# Patient Record
Sex: Male | Born: 1950 | Race: White | Hispanic: No | Marital: Married | Smoking: Never smoker
Health system: Southern US, Community
[De-identification: ages and names within clinical notes are randomized; demographics above are authoritative.]

## PROBLEM LIST (undated history)

## (undated) DIAGNOSIS — E785 Hyperlipidemia, unspecified: Secondary | ICD-10-CM

## (undated) DIAGNOSIS — R112 Nausea with vomiting, unspecified: Secondary | ICD-10-CM

## (undated) DIAGNOSIS — Z9889 Other specified postprocedural states: Secondary | ICD-10-CM

## (undated) DIAGNOSIS — N189 Chronic kidney disease, unspecified: Secondary | ICD-10-CM

## (undated) DIAGNOSIS — I499 Cardiac arrhythmia, unspecified: Secondary | ICD-10-CM

## (undated) DIAGNOSIS — G4730 Sleep apnea, unspecified: Secondary | ICD-10-CM

## (undated) HISTORY — DX: Chronic kidney disease, unspecified: N18.9

## (undated) HISTORY — PX: OTHER SURGICAL HISTORY: SHX169

## (undated) HISTORY — DX: Nausea with vomiting, unspecified: R11.2

## (undated) HISTORY — DX: Hyperlipidemia, unspecified: E78.5

## (undated) HISTORY — PX: CHOLECYSTECTOMY: SHX55

## (undated) HISTORY — DX: Other specified postprocedural states: Z98.890

---

## 1988-10-16 HISTORY — PX: KNEE ARTHROSCOPY W/ ACL RECONSTRUCTION: SHX1858

## 1990-10-16 HISTORY — PX: MENISCUS REPAIR: SHX5179

## 1994-10-16 DIAGNOSIS — Z9889 Other specified postprocedural states: Secondary | ICD-10-CM

## 1994-10-16 HISTORY — DX: Other specified postprocedural states: Z98.890

## 2010-10-16 HISTORY — PX: COLONOSCOPY: SHX174

## 2012-10-16 HISTORY — PX: ROTATOR CUFF REPAIR: SHX139

## 2016-10-16 DIAGNOSIS — C44229 Squamous cell carcinoma of skin of left ear and external auricular canal: Secondary | ICD-10-CM

## 2016-10-16 HISTORY — DX: Squamous cell carcinoma of skin of left ear and external auricular canal: C44.229

## 2016-11-24 ENCOUNTER — Other Ambulatory Visit: Payer: Medicare Other | Admitting: Internal Medicine

## 2016-11-24 DIAGNOSIS — Z13220 Encounter for screening for lipoid disorders: Secondary | ICD-10-CM

## 2016-11-24 DIAGNOSIS — Z125 Encounter for screening for malignant neoplasm of prostate: Secondary | ICD-10-CM

## 2016-11-24 DIAGNOSIS — Z0000 Encounter for general adult medical examination without abnormal findings: Secondary | ICD-10-CM

## 2016-11-24 LAB — CBC WITH DIFFERENTIAL/PLATELET
BASOS ABS: 48 {cells}/uL (ref 0–200)
Basophils Relative: 1 %
EOS PCT: 3 %
Eosinophils Absolute: 144 cells/uL (ref 15–500)
HCT: 42.8 % (ref 38.5–50.0)
Hemoglobin: 14.2 g/dL (ref 13.2–17.1)
Lymphocytes Relative: 42 %
Lymphs Abs: 2016 cells/uL (ref 850–3900)
MCH: 29.3 pg (ref 27.0–33.0)
MCHC: 33.2 g/dL (ref 32.0–36.0)
MCV: 88.4 fL (ref 80.0–100.0)
MONOS PCT: 10 %
MPV: 10 fL (ref 7.5–12.5)
Monocytes Absolute: 480 cells/uL (ref 200–950)
NEUTROS ABS: 2112 {cells}/uL (ref 1500–7800)
NEUTROS PCT: 44 %
PLATELETS: 243 10*3/uL (ref 140–400)
RBC: 4.84 MIL/uL (ref 4.20–5.80)
RDW: 13.6 % (ref 11.0–15.0)
WBC: 4.8 10*3/uL (ref 3.8–10.8)

## 2016-11-24 LAB — LIPID PANEL
Cholesterol: 156 mg/dL (ref <200)
HDL: 46 mg/dL (ref >40)
LDL CALC: 92 mg/dL (ref <100)
Total CHOL/HDL Ratio: 3.4 Ratio (ref <5.0)
Triglycerides: 91 mg/dL (ref <150)
VLDL: 18 mg/dL (ref <30)

## 2016-11-24 LAB — COMPREHENSIVE METABOLIC PANEL
ALT: 27 U/L (ref 9–46)
AST: 21 U/L (ref 10–35)
Albumin: 4.5 g/dL (ref 3.6–5.1)
Alkaline Phosphatase: 75 U/L (ref 40–115)
BUN: 23 mg/dL (ref 7–25)
CHLORIDE: 108 mmol/L (ref 98–110)
CO2: 23 mmol/L (ref 20–31)
CREATININE: 1.21 mg/dL (ref 0.70–1.25)
Calcium: 9.8 mg/dL (ref 8.6–10.3)
GLUCOSE: 100 mg/dL — AB (ref 65–99)
Potassium: 5.1 mmol/L (ref 3.5–5.3)
SODIUM: 141 mmol/L (ref 135–146)
Total Bilirubin: 1.3 mg/dL — ABNORMAL HIGH (ref 0.2–1.2)
Total Protein: 6.7 g/dL (ref 6.1–8.1)

## 2016-11-24 LAB — PSA: PSA: 1.5 ng/mL (ref <=4.0)

## 2016-11-28 ENCOUNTER — Encounter: Payer: Self-pay | Admitting: Internal Medicine

## 2016-11-28 ENCOUNTER — Ambulatory Visit (INDEPENDENT_AMBULATORY_CARE_PROVIDER_SITE_OTHER): Payer: Medicare Other | Admitting: Internal Medicine

## 2016-11-28 VITALS — BP 132/80 | HR 50 | Ht 67.0 in | Wt 206.0 lb

## 2016-11-28 DIAGNOSIS — I459 Conduction disorder, unspecified: Secondary | ICD-10-CM

## 2016-11-28 DIAGNOSIS — Z23 Encounter for immunization: Secondary | ICD-10-CM | POA: Diagnosis not present

## 2016-11-28 DIAGNOSIS — Z0000 Encounter for general adult medical examination without abnormal findings: Secondary | ICD-10-CM

## 2016-11-28 DIAGNOSIS — I455 Other specified heart block: Secondary | ICD-10-CM | POA: Diagnosis not present

## 2016-11-28 DIAGNOSIS — I440 Atrioventricular block, first degree: Secondary | ICD-10-CM | POA: Diagnosis not present

## 2016-11-28 LAB — POCT URINALYSIS DIPSTICK
Bilirubin, UA: NEGATIVE
Blood, UA: NEGATIVE
GLUCOSE UA: NEGATIVE
Ketones, UA: NEGATIVE
LEUKOCYTES UA: NEGATIVE
NITRITE UA: NEGATIVE
Protein, UA: NEGATIVE
Spec Grav, UA: 1.02
UROBILINOGEN UA: NEGATIVE
pH, UA: 6

## 2016-11-28 NOTE — Patient Instructions (Signed)
It was pleasure to see you today. Recommend 24-hour Holter monitor to check for possible dysrhythmia. Prevnar 13 given today. Return in one year or as needed.
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
## 2016-12-12 ENCOUNTER — Encounter: Payer: Self-pay | Admitting: *Deleted

## 2016-12-12 ENCOUNTER — Ambulatory Visit (INDEPENDENT_AMBULATORY_CARE_PROVIDER_SITE_OTHER): Payer: Medicare Other

## 2016-12-12 DIAGNOSIS — I440 Atrioventricular block, first degree: Secondary | ICD-10-CM | POA: Diagnosis not present

## 2016-12-12 NOTE — Progress Notes (Signed)
OK. Thanks.

## 2016-12-12 NOTE — Progress Notes (Signed)
Patient ID: Jacob Le, male   DOB: [birth year], 66 y.o.   MRN: [id number] Patient did not show up for 12/12/16, 12:00 PM, appointment, to have a 24 hour holter monitor applied.

## 2016-12-12 NOTE — Progress Notes (Signed)
Patient ID: Jacob Le, male   DOB: 1950-12-31, 66 y.o.   MRN: [id number] Patient showed up at 2:00 PM for his 12:00 PM, appointment to have a 24 hour holter monitor applied.  Monitor will be applied today.

## 2017-01-04 ENCOUNTER — Ambulatory Visit (INDEPENDENT_AMBULATORY_CARE_PROVIDER_SITE_OTHER): Payer: Medicare Other | Admitting: Pulmonary Disease

## 2017-01-04 ENCOUNTER — Encounter: Payer: Self-pay | Admitting: Pulmonary Disease

## 2017-01-04 VITALS — BP 118/78 | HR 69 | Ht 67.0 in | Wt 207.0 lb

## 2017-01-04 DIAGNOSIS — R0683 Snoring: Secondary | ICD-10-CM | POA: Diagnosis not present

## 2017-01-04 DIAGNOSIS — G4733 Obstructive sleep apnea (adult) (pediatric): Secondary | ICD-10-CM

## 2017-01-04 DIAGNOSIS — Z9989 Dependence on other enabling machines and devices: Secondary | ICD-10-CM

## 2017-01-04 NOTE — Patient Instructions (Signed)
Schedule home sleep test 

## 2017-01-04 NOTE — Assessment & Plan Note (Signed)
Given excessive daytime somnolence, narrow pharyngeal exam & loud snoring, obstructive sleep apnea is very likely & an overnight polysomnogram will be scheduled as a home sleep study. The pathophysiology of obstructive sleep apnea , it's cardiovascular consequences & modes of treatment including CPAP were discused with the patient in detail & they evidenced understanding.  Pretest probability is intermediate but he has a good cardiac profile

## 2017-01-04 NOTE — Progress Notes (Signed)
   Subjective:    Patient ID: Jacob Le, male    DOB: 01-06-1951, 66 y.o.   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 Loud snoring has been noted by his wife to the point where they have to sleep in different rooms. He reports sinus problems throughout his life. Snoring is worse when he has a drink or when he sleeps on his back. He reports excessive fatigue in the morning and non-refreshing sleep.  Epworth sleepiness score is 8 Bedtime is between 10 PM and 11:30 PM, sleep latency is minimal, sleeps on his side with one pillow, reports 2-4 nocturnal awakenings including nocturia and is out of bed by 7:30 AM feeling tired with dryness of mouth but denies headaches. It takes him a long time to get going. He reports occasional daytime naps allergies retired sleeps in a recliner for about 30 minutes.  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  Past Surgical History:  Procedure Laterality Date  . broken left shoulder    . pancreatis attack    . torn right rotator cuff       Allergies  Allergen Reactions  . Codeine Nausea And Vomiting     Social History   Social History  . Marital status: Married    Spouse name: N/A  . Number of children: N/A  . Years of education: N/A   Occupational History  . Not on file.   Social History Main Topics  . Smoking status: Never Smoker  . Smokeless tobacco: Never Used  . Alcohol use Yes     Comment: 4oz weekly   . Drug use: Unknown  . Sexual activity: Not on file   Other Topics Concern  . Not on file   Social History Narrative  . No narrative on file     Family History  Problem Relation Age of Onset    . Cancer Mother      Review of Systems Constitutional: negative for anorexia, fevers and sweats  Eyes: negative for irritation, redness and visual disturbance  Ears, nose, mouth, throat, and face: negative for earaches, epistaxis, nasal congestion and sore throat  Respiratory: negative for cough, dyspnea on exertion, sputum and wheezing  Cardiovascular: negative for chest pain, dyspnea, lower extremity edema, orthopnea, palpitations and syncope  Gastrointestinal: negative for abdominal pain, constipation, diarrhea, melena, nausea and vomiting  Genitourinary:negative for dysuria, frequency and hematuria  Hematologic/lymphatic: negative for bleeding, easy bruising and lymphadenopathy  Musculoskeletal:negative for arthralgias, muscle weakness and stiff joints  Neurological: negative for coordination problems, gait problems, headaches and weakness  Endocrine: negative for diabetic symptoms including polydipsia, polyuria and weight loss     Objective:   Physical Exam   Gen. Pleasant, obese, in no distress ENT - no lesions, no post nasal drip Neck: No JVD, no thyromegaly, no carotid bruits Lungs: no use of accessory muscles, no dullness to percussion, decreased without rales or rhonchi  Cardiovascular: Rhythm regular, heart sounds  normal, no murmurs or gallops, no peripheral edema Musculoskeletal: No deformities, no cyanosis or clubbing , no tremors        Assessment & Plan:

## 2017-03-31 DIAGNOSIS — G4733 Obstructive sleep apnea (adult) (pediatric): Secondary | ICD-10-CM | POA: Diagnosis not present

## 2017-04-02 ENCOUNTER — Telehealth: Payer: Self-pay | Admitting: Pulmonary Disease

## 2017-04-02 DIAGNOSIS — G4733 Obstructive sleep apnea (adult) (pediatric): Secondary | ICD-10-CM | POA: Diagnosis not present

## 2017-04-02 NOTE — Telephone Encounter (Signed)
Per RA, HST showed moderate OSA with 27 events per hour. Noticed the events more while he was sleeping on his back.   RA suggests CPAP titration study.

## 2017-04-03 NOTE — Telephone Encounter (Signed)
Patient returned call, [phone].

## 2017-04-03 NOTE — Telephone Encounter (Signed)
Left message for patient to call back  

## 2017-04-03 NOTE — Telephone Encounter (Signed)
Spoke with patient about results. He verbalized understanding. Wants to proceed with CPAP titration study. Order has been placed.

## 2017-04-05 ENCOUNTER — Other Ambulatory Visit: Payer: Self-pay | Admitting: *Deleted

## 2017-04-05 DIAGNOSIS — G4733 Obstructive sleep apnea (adult) (pediatric): Secondary | ICD-10-CM

## 2017-06-03 ENCOUNTER — Ambulatory Visit (HOSPITAL_BASED_OUTPATIENT_CLINIC_OR_DEPARTMENT_OTHER): Payer: Medicare Other | Attending: Pulmonary Disease | Admitting: Pulmonary Disease

## 2017-06-03 VITALS — Ht 69.0 in | Wt 200.0 lb

## 2017-06-03 DIAGNOSIS — G4733 Obstructive sleep apnea (adult) (pediatric): Secondary | ICD-10-CM | POA: Diagnosis present

## 2017-06-12 ENCOUNTER — Telehealth: Payer: Self-pay | Admitting: Pulmonary Disease

## 2017-06-12 DIAGNOSIS — G4733 Obstructive sleep apnea (adult) (pediatric): Secondary | ICD-10-CM

## 2017-06-12 DIAGNOSIS — G4730 Sleep apnea, unspecified: Secondary | ICD-10-CM | POA: Diagnosis not present

## 2017-06-12 NOTE — Procedures (Signed)
Patient Name: Jacob Le, Jacob Le Date: 06/03/2017 Gender: Male D.O.B: 1950-10-31 Age (years): 25 Referring Provider: Kara Mead MD, ABSM Height (inches): 69 Interpreting Physician: Kara Mead MD, ABSM Weight (lbs): 200 RPSGT: Madelon Lips BMI: 30 MRN: [id number] Neck Size: 16.25   CLINICAL INFORMATION The patient is referred for a CPAP titration to treat sleep apnea.  Date of HST: 03/2017, moderate OSA with AHi 27/h , worse when supine  SLEEP STUDY TECHNIQUE As per the AASM Manual for the Scoring of Sleep and Associated Events v2.3 (April 2016) with a hypopnea requiring 4% desaturations.  The channels recorded and monitored were frontal, central and occipital EEG, electrooculogram (EOG), submentalis EMG (chin), nasal and oral airflow, thoracic and abdominal wall motion, anterior tibialis EMG, snore microphone, electrocardiogram, and pulse oximetry. Continuous positive airway pressure (CPAP) was initiated at the beginning of the study and titrated to treat sleep-disordered breathing.  RESPIRATORY PARAMETERS Optimal PAP Pressure (cm): 7 AHI at Optimal Pressure (/hr): 0.9 Overall Minimal O2 (%): 91.00 Supine % at Optimal Pressure (%): 66 Minimal O2 at Optimal Pressure (%): 91.0     SLEEP ARCHITECTURE The study was initiated at 11:00:33 PM and ended at 5:04:31 AM.  Sleep onset time was 10.9 minutes and the sleep efficiency was 49.7%. The total sleep time was 181.0 minutes.  The patient spent 19.34% of the night in stage N1 sleep, 69.34% in stage N2 sleep, 0.28% in stage N3 and 11.05% in REM.Stage REM latency was 161.0 minutes  Wake after sleep onset was 172.0. Alpha intrusion was absent. Supine sleep was 67.43%.  CARDIAC DATA The 2 lead EKG demonstrated sinus rhythm. The mean heart rate was 59.28 beats per minute. Other EKG findings include: PVCs.   LEG MOVEMENT DATA The total Periodic Limb Movements of Sleep (PLMS) were 0. The PLMS index was 0.00. A PLMS index of <15 is  considered normal in adults.  IMPRESSIONS - The optimal PAP pressure was 7 cm of water. - Central sleep apnea was not noted during this titration (CAI = 0.7/h). - Severe oxygen desaturations were observed during this titration (min O2 = 91.00%). - The patient snored with Moderate snoring volume during this titration study. - 2-lead EKG demonstrated: PVCs - Clinically significant periodic limb movements were not noted during this study. Arousals associated with PLMs were rare.   DIAGNOSIS - Obstructive Sleep Apnea (327.23 [G47.33 ICD-10])   RECOMMENDATIONS - Trial of CPAP therapy on 7 cm H2O with a Medium size Resmed Full Face Mask AirFit F20 mask and heated humidification. - Avoid alcohol, sedatives and other CNS depressants that may worsen sleep apnea and disrupt normal sleep architecture. - Sleep hygiene should be reviewed to assess factors that may improve sleep quality. - Weight management and regular exercise should be initiated or continued. - Return to Sleep Center for re-evaluation after 4 weeks of therapy    Kara Mead MD Board Certified in [place]

## 2017-06-12 NOTE — Telephone Encounter (Signed)
Please send prescription for  CPAP therapy on 7 cm H2O with a Medium size Resmed Full Face Mask AirFit F20 mask and heated humidification  Download in 4 weeks. Office visit with NP/me in 6 weeks

## 2017-06-14 NOTE — Telephone Encounter (Signed)
Results have been explained to patient, pt expressed understanding.  Appt scheduled with Eric Form at 10:30 on 08/06/17.   Will send to [facility] to set reminder for 4 week download.   Nothing further needed.

## 2017-06-14 NOTE — Telephone Encounter (Signed)
Patient is returning phone call.  °

## 2017-06-14 NOTE — Telephone Encounter (Signed)
Left a message for patient to call back. 

## 2017-08-06 ENCOUNTER — Ambulatory Visit: Payer: Medicare Other | Admitting: Acute Care

## 2017-08-16 ENCOUNTER — Encounter: Payer: Self-pay | Admitting: Pulmonary Disease

## 2017-08-17 ENCOUNTER — Telehealth: Payer: Self-pay | Admitting: Pulmonary Disease

## 2017-08-17 DIAGNOSIS — G4733 Obstructive sleep apnea (adult) (pediatric): Secondary | ICD-10-CM

## 2017-08-17 NOTE — Telephone Encounter (Signed)
Left message for patient to call back  

## 2017-08-20 NOTE — Telephone Encounter (Signed)
Patient has been added to [facility] thanks to [facility]. Will place DL in RA's look-at folder for review.

## 2017-08-20 NOTE — Telephone Encounter (Signed)
Pt returned call.-tr °

## 2017-08-20 NOTE — Telephone Encounter (Signed)
Spoke with patient. He received his new CPAP machine from [facility] on 08/06/17. He was able to use the machine without any problems for the first time. Since then, he is now waking up in the middle of the night in a panic, feeling like he is SOB. He stated that he has never felt this sensation before, even while awake. He is currently using a full face mask.   Advised patient that I would get a recent download from [facility] to have for RA to review. He verbalized understanding.   Will contact [facility] after 1 since they are closed for lunch 12-1.

## 2017-08-20 NOTE — Telephone Encounter (Signed)
lmomtcb x 2 for the pt.  

## 2017-08-23 NOTE — Telephone Encounter (Signed)
Pt is calling for update on cpap concerns. It appears that DL was placed in RA's cubby for review on 08/20/17.  RA please advise. Thanks.

## 2017-08-23 NOTE — Telephone Encounter (Signed)
Have not seen this download yet

## 2017-08-23 NOTE — Telephone Encounter (Signed)
Will place copy of download on your desk.

## 2017-08-23 NOTE — Telephone Encounter (Signed)
Patient is calling to follow up from 11/05 as he has not heard anything, CB is [phone].

## 2017-08-24 NOTE — Telephone Encounter (Signed)
Spoke with pt, advised message from RA. Pt is concerned that he did not have a diagnosis given to him and was told to start CPAP therapy. . I tried to explain to the best of my ability to explain to pt but he agreed to try new setting and call us for appt if he does not doing well on CPAP. Nothing further [place] needed.

## 2017-08-24 NOTE — Telephone Encounter (Signed)
Reviewed download-shows a few residual events mainly obstructive with AHI 13/hour on CPAP 7 cm. Change to auto 5-10 cm and get another download and follow-up in 4 weeks

## 2017-08-29 ENCOUNTER — Ambulatory Visit: Payer: Medicare Other | Admitting: Acute Care

## 2017-09-11 ENCOUNTER — Encounter: Payer: Self-pay | Admitting: Pulmonary Disease

## 2017-09-12 ENCOUNTER — Encounter: Payer: Self-pay | Admitting: Pulmonary Disease

## 2017-09-12 ENCOUNTER — Ambulatory Visit: Payer: Medicare Other | Admitting: Pulmonary Disease

## 2017-09-12 VITALS — BP 116/70 | HR 58 | Ht 69.0 in | Wt 218.2 lb

## 2017-09-12 DIAGNOSIS — G4733 Obstructive sleep apnea (adult) (pediatric): Secondary | ICD-10-CM

## 2017-09-12 DIAGNOSIS — Z9989 Dependence on other enabling machines and devices: Secondary | ICD-10-CM

## 2017-09-12 MED ORDER — AZELASTINE-FLUTICASONE 137-50 MCG/ACT NA SUSP: 1.0000 | Freq: Every day | NASAL | 0 refills | Status: DC

## 2017-09-12 NOTE — Assessment & Plan Note (Signed)
Increase CPAP to 5-12 cm & recheck DL in 4 wks Trial of melatonin 5 mg 1 hr prior to bedtime   Weight loss encouraged, compliance with goal of at least 4-6 hrs every night is the expectation. Advised against medications with sedative side effects Cautioned against driving when sleepy - understanding that sleepiness will vary on a day to day basis   Trial of dymista nasal spray at bedtime each nare

## 2017-09-12 NOTE — Progress Notes (Signed)
   Subjective:    Patient ID: Jacob Le, male    DOB: Sep 24, 1951, 66 y.o.   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  His home sleep study showed moderate OSA and he was started on CPAP which she only obtained in October, he has used this now for a few weeks and feels better on the nights he is able to use it. He has several questions. -Ramp settings was discussed -He is able to use a humidifier -Pressure was too low and was changed to auto CPAP settings 5-10 cm.  Download was reviewed on auto settings which shows residual AHI of 7/hour with average pressure of 10 cm with minimal leak  He also reports persistent and long-standing postnasal drip and some degree of sleep maintenance insomnia  Significant tests/ events reviewed  HST 03/2017 AHI 27/h 05/2017 CPAP 7  Review of Systems   neg for any significant sore throat, dysphagia, itching, sneezing, nasal congestion or excess/ purulent secretions, fever, chills, sweats, unintended wt loss, pleuritic or exertional cp, hempoptysis, orthopnea pnd or change in chronic leg swelling. Also denies presyncope, palpitations, heartburn, abdominal pain, nausea, vomiting, diarrhea or change in bowel or urinary habits, dysuria,hematuria, rash, arthralgias, visual complaints, headache, numbness weakness or ataxia.     Objective:   Physical Exam   Gen. Pleasant, well-nourished, in no distress ENT - no thrush, no post nasal drip Neck: No JVD, no thyromegaly, no carotid bruits Lungs: no use of accessory muscles, no dullness to percussion, clear without rales or rhonchi  Cardiovascular: Rhythm regular, heart sounds  normal, no murmurs or gallops, no peripheral edema Musculoskeletal: No deformities, no cyanosis or clubbing         Assessment & Plan:

## 2017-09-12 NOTE — Patient Instructions (Signed)
Increase CPAP to 5-12 cm & recheck DL in 4 wks Trial of melatonin 5 mg 1 hr prior to bedtime  Trial of dymista nasal spray at bedtime each nare

## 2017-11-12 ENCOUNTER — Encounter: Payer: Self-pay | Admitting: Pulmonary Disease

## 2017-11-12 ENCOUNTER — Ambulatory Visit: Payer: Medicare Other | Admitting: Pulmonary Disease

## 2017-11-12 DIAGNOSIS — G4733 Obstructive sleep apnea (adult) (pediatric): Secondary | ICD-10-CM

## 2017-11-12 DIAGNOSIS — Z9989 Dependence on other enabling machines and devices: Secondary | ICD-10-CM | POA: Diagnosis not present

## 2017-11-12 DIAGNOSIS — J301 Allergic rhinitis due to pollen: Secondary | ICD-10-CM

## 2017-11-12 DIAGNOSIS — J309 Allergic rhinitis, unspecified: Secondary | ICD-10-CM | POA: Insufficient documentation

## 2017-11-12 NOTE — Assessment & Plan Note (Signed)
He will call our sleep center and going for mask desensitization, hopefully we will find a full facemask that works for him and get him more comfortable.  We will review download in 3 months and see if residual events have decreased  Weight loss encouraged, compliance with goal of at least 4-6 hrs every night is the expectation. Advised against medications with sedative side effects Cautioned against driving when sleepy - understanding that sleepiness will vary on a day to day basis

## 2017-11-12 NOTE — Progress Notes (Signed)
   Subjective:    Patient ID: Jacob Le, male    DOB: 1951-05-09, 67 y.o.   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 Initial titration showed CPAP of 7 cm but he had residual events on his download and hence this was titrated to auto settings 5-10 cm and still had residual events and finally change to auto settings 5-12 cm. On this settings download was reviewed which still shows few residual obstructive as well as central apneas, average AHI 9/hour with average pressure of 11 cm. Compliance is good with the settings and no leak He feels that the pressure is good and feels better rested with only 5.5-6 hours of sleep. He did not like nasal pillows and finally settled on a full facemask, still struggling with this mask a little bit, he has a air fit full facemask  Significant tests/ events reviewed  HST 03/2017 AHI 27/h 05/2017 CPAP 7  Review of Systems neg for any significant sore throat, dysphagia, itching, sneezing, nasal congestion or excess/ purulent secretions, fever, chills, sweats, unintended wt loss, pleuritic or exertional cp, hempoptysis, orthopnea pnd or change in chronic leg swelling. Also denies presyncope, palpitations, heartburn, abdominal pain, nausea, vomiting, diarrhea or change in bowel or urinary habits, dysuria,hematuria, rash, arthralgias, visual complaints, headache, numbness weakness or ataxia.     Objective:   Physical Exam   Gen. Pleasant, well-nourished, in no distress ENT - no thrush, no post nasal drip Neck: No JVD, no thyromegaly, no carotid bruits Lungs: no use of accessory muscles, no dullness to percussion, clear without rales or rhonchi  Cardiovascular: Rhythm regular, heart sounds  normal, no murmurs or gallops, no peripheral edema Musculoskeletal: No deformities, no cyanosis or clubbing         Assessment & Plan:

## 2017-11-12 NOTE — Patient Instructions (Signed)
Prescription for Dymista will be sent.  Please call sleep center at [phone] and make appointment for mask desensitization session

## 2017-11-12 NOTE — Assessment & Plan Note (Signed)
Prescription for Dymista will be sent.

## 2017-11-28 ENCOUNTER — Ambulatory Visit (HOSPITAL_BASED_OUTPATIENT_CLINIC_OR_DEPARTMENT_OTHER): Payer: Medicare Other | Attending: Pulmonary Disease | Admitting: Radiology

## 2017-12-17 ENCOUNTER — Inpatient Hospital Stay (HOSPITAL_COMMUNITY)
Admission: EM | Admit: 2017-12-17 | Discharge: 2017-12-18 | DRG: 287 | Disposition: A | Discharge status: Home / Self Care | Payer: Medicare Other | Attending: Cardiovascular Disease | Admitting: Cardiovascular Disease

## 2017-12-17 DIAGNOSIS — I441 Atrioventricular block, second degree: Secondary | ICD-10-CM

## 2017-12-17 DIAGNOSIS — I213 ST elevation (STEMI) myocardial infarction of unspecified site: Secondary | ICD-10-CM | POA: Diagnosis present

## 2017-12-17 DIAGNOSIS — G4733 Obstructive sleep apnea (adult) (pediatric): Secondary | ICD-10-CM | POA: Diagnosis present

## 2017-12-17 DIAGNOSIS — R030 Elevated blood-pressure reading, without diagnosis of hypertension: Secondary | ICD-10-CM | POA: Diagnosis present

## 2017-12-17 DIAGNOSIS — I309 Acute pericarditis, unspecified: Secondary | ICD-10-CM | POA: Diagnosis not present

## 2017-12-17 DIAGNOSIS — Z9989 Dependence on other enabling machines and devices: Secondary | ICD-10-CM

## 2017-12-17 DIAGNOSIS — M542 Cervicalgia: Secondary | ICD-10-CM | POA: Diagnosis present

## 2017-12-17 DIAGNOSIS — R079 Chest pain, unspecified: Secondary | ICD-10-CM | POA: Diagnosis not present

## 2017-12-17 DIAGNOSIS — Z885 Allergy status to narcotic agent status: Secondary | ICD-10-CM

## 2017-12-17 HISTORY — DX: Cardiac arrhythmia, unspecified: I49.9

## 2017-12-17 HISTORY — DX: Sleep apnea, unspecified: G47.30

## 2017-12-17 MED ORDER — ASPIRIN 81 MG PO CHEW
324.0000 mg | CHEWABLE_TABLET | Freq: Once | ORAL | Status: AC
  Administered 2017-12-18: 324 mg via ORAL
  Filled 2017-12-17: qty 4

## 2017-12-17 MED ORDER — HEPARIN SODIUM (PORCINE) 5000 UNIT/ML IJ SOLN
INTRAMUSCULAR | Status: AC
  Filled 2017-12-17: qty 1

## 2017-12-17 MED ORDER — SODIUM CHLORIDE 0.9 % IV SOLN
INTRAVENOUS | Status: DC
  Administered 2017-12-17: via INTRAVENOUS

## 2017-12-17 MED ORDER — HEPARIN SODIUM (PORCINE) 5000 UNIT/ML IJ SOLN
4000.0000 [IU] | Freq: Once | INTRAMUSCULAR | Status: AC
  Administered 2017-12-18: 4000 [IU] via INTRAVENOUS

## 2017-12-17 NOTE — ED Triage Notes (Signed)
Pt complaining of R neck pain that started around 3 pm this afternoon. Pt reports it has worsened and moved into his R chest. Pt endorses SHOB.

## 2017-12-17 NOTE — ED Provider Notes (Signed)
[facility] EMERGENCY DEPARTMENT Provider Note   CSN: [id number] Arrival date & time: 12/17/17  2329     History   Chief Complaint Chief Complaint  Patient presents with  . Neck Pain  . Chest Pain  . Code STEMI    HPI Jacob Le is a 67 y.o. male.  Patient is a 67 year old male with past medical history of obstructive sleep apnea and Mobitz 1 AV block.  He presents today for evaluation of chest discomfort.  He states he began earlier this afternoon with discomfort in his upper chest and left shoulder.  He denies nausea, diaphoresis, but does report it feels somewhat difficult to "catch his breath".  He denies any recent injury or trauma.  He has no prior coronary artery disease.  He has never had a heart cath, but does state that he had a stress test in the past which was normal.  He took ibuprofen this evening with little relief.   The history is provided by the patient.  Neck Pain   This is a new problem. Episode onset: 3 PM. The problem occurs constantly. The problem has been gradually worsening. The pain is associated with nothing. There has been no fever. The pain does not radiate. The pain is moderate.    No past medical history on file.  Patient Active Problem List   Diagnosis Date Noted  . Allergic rhinitis 11/12/2017  . OSA on CPAP 01/04/2017  . First degree AV block 12/12/2016    Past Surgical History:  Procedure Laterality Date  . broken left shoulder    . pancreatis attack    . torn right rotator cuff         Home Medications    Prior to Admission medications   Not on File    Family History Family History  Problem Relation Age of Onset  . Cancer Mother     Social History Social History   Tobacco Use  . Smoking status: Never Smoker  . Smokeless tobacco: Never Used  Substance Use Topics  . Alcohol use: Yes    Comment: 4oz weekly   . Drug use: Not on file     Allergies   Codeine   Review of Systems Review of  Systems  Musculoskeletal: Positive for neck pain.  All other systems reviewed and are negative.    Physical Exam Updated Vital Signs BP (!) 194/110 (BP Location: Right Arm)   Pulse 76   Temp 98.1 F (36.7 C) (Oral)   Ht 5\' 9"  (1.753 m)   Wt 95.3 kg (210 lb)   SpO2 98%   BMI 31.01 kg/m   Physical Exam  Constitutional: He is oriented to person, place, and time. He appears well-developed and well-nourished. No distress.  HENT:  Head: Normocephalic and atraumatic.  Mouth/Throat: Oropharynx is clear and moist.  Neck: Normal range of motion. Neck supple.  Cardiovascular: Normal rate and regular rhythm. Exam reveals no friction rub.  No murmur heard. Pulmonary/Chest: Effort normal and breath sounds normal. No respiratory distress. He has no wheezes. He has no rales.  Abdominal: Soft. Bowel sounds are normal. He exhibits no distension. There is no tenderness.  Musculoskeletal: Normal range of motion. He exhibits no edema.       Right lower leg: Normal. He exhibits no tenderness and no edema.       Left lower leg: Normal. He exhibits no tenderness and no edema.  Neurological: He is alert and oriented to person, place, and time.  Coordination normal.  Skin: Skin is warm and dry. He is not diaphoretic.  Nursing note and vitals reviewed.    ED Treatments / Results  Labs (all labs ordered are listed, but only abnormal results are displayed) Labs Reviewed  CBC WITH DIFFERENTIAL/PLATELET  PROTIME-INR  APTT  COMPREHENSIVE METABOLIC PANEL  TROPONIN I  LIPID PANEL    EKG  EKG Interpretation  Date/Time:  Monday December 17 2017 23:40:30 EST Ventricular Rate:  77 PR Interval:    QRS Duration: 78 QT Interval:  364 QTC Calculation: 411 R Axis:   45 Text Interpretation:   Critical Test Result: AV Block , STEMI Sinus rhythm with 2nd degree A-V block (Mobitz I) ST elevation consider inferior injury or acute infarct ** ** ACUTE MI / STEMI  Consider right ventricular involvement in acute  inferior infarct Abnormal ECG Confirmed by Veryl Speak [phone]) on 12/18/2017 12:04:25 AM       Radiology No results found.  Procedures Procedures (including critical care time)  Medications Ordered in ED Medications  0.9 %  sodium chloride infusion ( Intravenous New Bag/Given 12/17/17 2358)  aspirin chewable tablet 324 mg (not administered)  heparin injection 4,000 Units (not administered)     Initial Impression / Assessment and Plan / ED Course  I have reviewed the triage vital signs and the nursing notes.  Pertinent labs & imaging results that were available during my care of the patient were reviewed by me and considered in my medical decision making (see chart for details).  Patient presenting with chest discomfort.  His initial EKG reveals possible ST elevation in the inferior leads and a second-degree Mobitz 1 heart block.  A code STEMI was initiated in the EKG discussed with Dr. Angelena Form.  Patient will be taken to the Cath Lab for emergent catheterization.  CRITICAL CARE Performed by: Veryl Speak Total critical care time: 35 minutes Critical care time was exclusive of separately billable procedures and treating other patients. Critical care was necessary to treat or prevent imminent or life-threatening deterioration. Critical care was time spent personally by me on the following activities: development of treatment plan with patient and/or surrogate as well as nursing, discussions with consultants, evaluation of patient's response to treatment, examination of patient, obtaining history from patient or surrogate, ordering and performing treatments and interventions, ordering and review of laboratory studies, ordering and review of radiographic studies, pulse oximetry and re-evaluation of patient's condition.   Final Clinical Impressions(s) / ED Diagnoses   Final diagnoses:  None    ED Discharge Orders    None       Veryl Speak, MD 12/18/17 [phone]

## 2017-12-17 NOTE — ED Notes (Signed)
Paged CODE STEMI per Dr.Delo

## 2017-12-18 ENCOUNTER — Encounter (HOSPITAL_COMMUNITY): Payer: Self-pay | Admitting: *Deleted

## 2017-12-18 ENCOUNTER — Other Ambulatory Visit: Payer: Self-pay

## 2017-12-18 ENCOUNTER — Inpatient Hospital Stay (HOSPITAL_COMMUNITY): Payer: Medicare Other

## 2017-12-18 ENCOUNTER — Encounter (HOSPITAL_COMMUNITY)
Admission: EM | Disposition: A | Discharge status: Home / Self Care | Payer: Self-pay | Source: Home / Self Care | Attending: Cardiovascular Disease

## 2017-12-18 ENCOUNTER — Emergency Department (HOSPITAL_COMMUNITY): Payer: Medicare Other

## 2017-12-18 ENCOUNTER — Other Ambulatory Visit (HOSPITAL_COMMUNITY): Payer: Medicare Other

## 2017-12-18 DIAGNOSIS — I441 Atrioventricular block, second degree: Secondary | ICD-10-CM

## 2017-12-18 DIAGNOSIS — I2510 Atherosclerotic heart disease of native coronary artery without angina pectoris: Secondary | ICD-10-CM

## 2017-12-18 DIAGNOSIS — Z9989 Dependence on other enabling machines and devices: Secondary | ICD-10-CM | POA: Diagnosis not present

## 2017-12-18 DIAGNOSIS — G4733 Obstructive sleep apnea (adult) (pediatric): Secondary | ICD-10-CM | POA: Diagnosis present

## 2017-12-18 DIAGNOSIS — I340 Nonrheumatic mitral (valve) insufficiency: Secondary | ICD-10-CM

## 2017-12-18 DIAGNOSIS — I213 ST elevation (STEMI) myocardial infarction of unspecified site: Secondary | ICD-10-CM | POA: Diagnosis present

## 2017-12-18 DIAGNOSIS — I309 Acute pericarditis, unspecified: Secondary | ICD-10-CM

## 2017-12-18 DIAGNOSIS — M542 Cervicalgia: Secondary | ICD-10-CM | POA: Diagnosis present

## 2017-12-18 DIAGNOSIS — R079 Chest pain, unspecified: Secondary | ICD-10-CM | POA: Diagnosis present

## 2017-12-18 DIAGNOSIS — I351 Nonrheumatic aortic (valve) insufficiency: Secondary | ICD-10-CM

## 2017-12-18 DIAGNOSIS — I10 Essential (primary) hypertension: Secondary | ICD-10-CM | POA: Diagnosis not present

## 2017-12-18 DIAGNOSIS — I308 Other forms of acute pericarditis: Secondary | ICD-10-CM

## 2017-12-18 DIAGNOSIS — Z885 Allergy status to narcotic agent status: Secondary | ICD-10-CM | POA: Diagnosis not present

## 2017-12-18 DIAGNOSIS — R030 Elevated blood-pressure reading, without diagnosis of hypertension: Secondary | ICD-10-CM | POA: Diagnosis present

## 2017-12-18 HISTORY — PX: LEFT HEART CATH AND CORONARY ANGIOGRAPHY: CATH118249

## 2017-12-18 LAB — COMPREHENSIVE METABOLIC PANEL
ALT: 41 U/L (ref 17–63)
ANION GAP: 13 (ref 5–15)
AST: 35 U/L (ref 15–41)
Albumin: 4.7 g/dL (ref 3.5–5.0)
Alkaline Phosphatase: 75 U/L (ref 38–126)
BUN: 36 mg/dL — ABNORMAL HIGH (ref 6–20)
CALCIUM: 9.7 mg/dL (ref 8.9–10.3)
CHLORIDE: 107 mmol/L (ref 101–111)
CO2: 21 mmol/L — AB (ref 22–32)
CREATININE: 1.64 mg/dL — AB (ref 0.61–1.24)
GFR calc Af Amer: 48 mL/min — ABNORMAL LOW (ref >60)
GFR, EST NON AFRICAN AMERICAN: 42 mL/min — AB (ref >60)
Glucose, Bld: 134 mg/dL — ABNORMAL HIGH (ref 65–99)
Potassium: 4.4 mmol/L (ref 3.5–5.1)
SODIUM: 141 mmol/L (ref 135–145)
Total Bilirubin: 1.1 mg/dL (ref 0.3–1.2)
Total Protein: 7.3 g/dL (ref 6.5–8.1)

## 2017-12-18 LAB — CBC
HCT: 41.2 % (ref 39.0–52.0)
HEMOGLOBIN: 14.1 g/dL (ref 13.0–17.0)
MCH: 30.3 pg (ref 26.0–34.0)
MCHC: 34.2 g/dL (ref 30.0–36.0)
MCV: 88.6 fL (ref 78.0–100.0)
Platelets: 206 10*3/uL (ref 150–400)
RBC: 4.65 MIL/uL (ref 4.22–5.81)
RDW: 13.3 % (ref 11.5–15.5)
WBC: 8.9 10*3/uL (ref 4.0–10.5)

## 2017-12-18 LAB — HEPATIC FUNCTION PANEL
ALK PHOS: 63 U/L (ref 38–126)
ALT: 37 U/L (ref 17–63)
AST: 31 U/L (ref 15–41)
Albumin: 3.9 g/dL (ref 3.5–5.0)
BILIRUBIN INDIRECT: 0.5 mg/dL (ref 0.3–0.9)
Bilirubin, Direct: 0.2 mg/dL (ref 0.1–0.5)
TOTAL PROTEIN: 6.2 g/dL — AB (ref 6.5–8.1)
Total Bilirubin: 0.7 mg/dL (ref 0.3–1.2)

## 2017-12-18 LAB — PROTIME-INR
INR: 0.97
PROTHROMBIN TIME: 12.8 s (ref 11.4–15.2)

## 2017-12-18 LAB — CBC WITH DIFFERENTIAL/PLATELET
BASOS ABS: 0.1 10*3/uL (ref 0.0–0.1)
Basophils Relative: 1 %
EOS ABS: 0.3 10*3/uL (ref 0.0–0.7)
EOS PCT: 3 %
HCT: 46.8 % (ref 39.0–52.0)
Hemoglobin: 16.3 g/dL (ref 13.0–17.0)
Lymphocytes Relative: 30 %
Lymphs Abs: 3.3 10*3/uL (ref 0.7–4.0)
MCH: 31 pg (ref 26.0–34.0)
MCHC: 34.8 g/dL (ref 30.0–36.0)
MCV: 89 fL (ref 78.0–100.0)
MONO ABS: 0.6 10*3/uL (ref 0.1–1.0)
Monocytes Relative: 5 %
Neutro Abs: 6.6 10*3/uL (ref 1.7–7.7)
Neutrophils Relative %: 61 %
PLATELETS: 259 10*3/uL (ref 150–400)
RBC: 5.26 MIL/uL (ref 4.22–5.81)
RDW: 13.2 % (ref 11.5–15.5)
WBC: 10.8 10*3/uL — AB (ref 4.0–10.5)

## 2017-12-18 LAB — TROPONIN I
Troponin I: <0.03 ng/mL (ref <0.03)
Troponin I: <0.03 ng/mL (ref <0.03)

## 2017-12-18 LAB — BASIC METABOLIC PANEL
ANION GAP: 9 (ref 5–15)
BUN: 30 mg/dL — ABNORMAL HIGH (ref 6–20)
CALCIUM: 8.7 mg/dL — AB (ref 8.9–10.3)
CHLORIDE: 112 mmol/L — AB (ref 101–111)
CO2: 18 mmol/L — AB (ref 22–32)
Creatinine, Ser: 1.25 mg/dL — ABNORMAL HIGH (ref 0.61–1.24)
GFR calc non Af Amer: 57 mL/min — ABNORMAL LOW (ref >60)
GLUCOSE: 125 mg/dL — AB (ref 65–99)
Potassium: 4 mmol/L (ref 3.5–5.1)
Sodium: 139 mmol/L (ref 135–145)

## 2017-12-18 LAB — LIPID PANEL
CHOLESTEROL: 210 mg/dL — AB (ref 0–200)
HDL: 53 mg/dL (ref >40)
LDL Cholesterol: 98 mg/dL (ref 0–99)
TRIGLYCERIDES: 293 mg/dL — AB (ref <150)
Total CHOL/HDL Ratio: 4 RATIO
VLDL: 59 mg/dL — ABNORMAL HIGH (ref 0–40)

## 2017-12-18 LAB — HEMOGLOBIN A1C
HEMOGLOBIN A1C: 5.7 % — AB (ref 4.8–5.6)
Mean Plasma Glucose: 116.89 mg/dL

## 2017-12-18 LAB — TSH: TSH: 1.95 u[IU]/mL (ref 0.350–4.500)

## 2017-12-18 LAB — ECHOCARDIOGRAM COMPLETE
Height: 69 in
WEIGHTICAEL: 3474.45 [oz_av]

## 2017-12-18 LAB — MAGNESIUM: Magnesium: 1.9 mg/dL (ref 1.7–2.4)

## 2017-12-18 LAB — SEDIMENTATION RATE: SED RATE: 5 mm/h (ref 0–16)

## 2017-12-18 LAB — APTT: APTT: 30 s (ref 24–36)

## 2017-12-18 LAB — T4, FREE: Free T4: 1.1 ng/dL (ref 0.61–1.12)

## 2017-12-18 SURGERY — LEFT HEART CATH AND CORONARY ANGIOGRAPHY
Anesthesia: LOCAL

## 2017-12-18 MED ORDER — ACETAMINOPHEN 325 MG PO TABS: 650.0000 mg | ORAL_TABLET | ORAL | Status: DC | PRN

## 2017-12-18 MED ORDER — COLCHICINE 0.6 MG PO TABS: 0.6000 mg | ORAL_TABLET | Freq: Two times a day (BID) | ORAL | 0 refills | Status: DC

## 2017-12-18 MED ORDER — IBUPROFEN 800 MG PO TABS
800.0000 mg | ORAL_TABLET | Freq: Three times a day (TID) | ORAL | Status: DC
  Administered 2017-12-18 (×2): 800 mg via ORAL
  Filled 2017-12-18 (×2): qty 1

## 2017-12-18 MED ORDER — FENTANYL CITRATE (PF) 100 MCG/2ML IJ SOLN
INTRAMUSCULAR | Status: AC
  Filled 2017-12-18: qty 2

## 2017-12-18 MED ORDER — SODIUM CHLORIDE 0.9 % IV SOLN: 250.0000 mL | INTRAVENOUS | Status: DC | PRN

## 2017-12-18 MED ORDER — VERAPAMIL HCL 2.5 MG/ML IV SOLN
INTRAVENOUS | Status: DC | PRN
  Administered 2017-12-18: 10 mL via INTRA_ARTERIAL

## 2017-12-18 MED ORDER — HEPARIN (PORCINE) IN NACL 2-0.9 UNIT/ML-% IJ SOLN
INTRAMUSCULAR | Status: AC | PRN
  Administered 2017-12-18: 1000 mL

## 2017-12-18 MED ORDER — LIDOCAINE HCL (PF) 1 % IJ SOLN
INTRAMUSCULAR | Status: DC | PRN
  Administered 2017-12-18: 2 mL via SUBCUTANEOUS

## 2017-12-18 MED ORDER — IBUPROFEN 800 MG PO TABS: 800.0000 mg | ORAL_TABLET | Freq: Three times a day (TID) | ORAL | 0 refills | Status: DC

## 2017-12-18 MED ORDER — ATORVASTATIN CALCIUM 80 MG PO TABS
80.0000 mg | ORAL_TABLET | Freq: Every day | ORAL | Status: DC
  Administered 2017-12-18: 80 mg via ORAL
  Filled 2017-12-18: qty 1

## 2017-12-18 MED ORDER — TRAZODONE HCL 100 MG PO TABS
100.0000 mg | ORAL_TABLET | Freq: Every day | ORAL | Status: DC
  Administered 2017-12-18: 100 mg via ORAL
  Filled 2017-12-18: qty 1

## 2017-12-18 MED ORDER — METOPROLOL TARTRATE 12.5 MG HALF TABLET
12.5000 mg | ORAL_TABLET | Freq: Two times a day (BID) | ORAL | Status: DC
  Administered 2017-12-18: 12.5 mg via ORAL
  Filled 2017-12-18: qty 1

## 2017-12-18 MED ORDER — ATORVASTATIN CALCIUM 40 MG PO TABS: 40.0000 mg | ORAL_TABLET | Freq: Every day | ORAL | 6 refills | Status: DC

## 2017-12-18 MED ORDER — SODIUM CHLORIDE 0.9% FLUSH
3.0000 mL | Freq: Two times a day (BID) | INTRAVENOUS | Status: DC
  Administered 2017-12-18: 3 mL via INTRAVENOUS

## 2017-12-18 MED ORDER — KETOROLAC TROMETHAMINE 30 MG/ML IJ SOLN
30.0000 mg | Freq: Once | INTRAMUSCULAR | Status: AC
  Administered 2017-12-18: 30 mg via INTRAVENOUS
  Filled 2017-12-18: qty 1

## 2017-12-18 MED ORDER — SODIUM CHLORIDE 0.9% FLUSH: 3.0000 mL | INTRAVENOUS | Status: DC | PRN

## 2017-12-18 MED ORDER — HEPARIN BOLUS VIA INFUSION: 4000.0000 [IU] | Freq: Once | INTRAVENOUS | Status: DC

## 2017-12-18 MED ORDER — ONDANSETRON HCL 4 MG/2ML IJ SOLN: 4.0000 mg | Freq: Four times a day (QID) | INTRAMUSCULAR | Status: DC | PRN

## 2017-12-18 MED ORDER — MIDAZOLAM HCL 2 MG/2ML IJ SOLN
INTRAMUSCULAR | Status: DC | PRN
  Administered 2017-12-18: 2 mg via INTRAVENOUS

## 2017-12-18 MED ORDER — MIDAZOLAM HCL 2 MG/2ML IJ SOLN
INTRAMUSCULAR | Status: AC
  Filled 2017-12-18: qty 2

## 2017-12-18 MED ORDER — PANTOPRAZOLE SODIUM 40 MG PO TBEC
40.0000 mg | DELAYED_RELEASE_TABLET | Freq: Every day | ORAL | Status: DC
  Administered 2017-12-18: 40 mg via ORAL
  Filled 2017-12-18: qty 1

## 2017-12-18 MED ORDER — HEPARIN (PORCINE) IN NACL 2-0.9 UNIT/ML-% IJ SOLN
INTRAMUSCULAR | Status: AC
  Filled 2017-12-18: qty 1000

## 2017-12-18 MED ORDER — VERAPAMIL HCL 2.5 MG/ML IV SOLN
INTRAVENOUS | Status: AC
  Filled 2017-12-18: qty 2

## 2017-12-18 MED ORDER — HEPARIN (PORCINE) IN NACL 100-0.45 UNIT/ML-% IJ SOLN
1000.0000 [IU]/h | INTRAMUSCULAR | Status: DC
  Filled 2017-12-18: qty 250

## 2017-12-18 MED ORDER — LIDOCAINE HCL 1 % IJ SOLN
INTRAMUSCULAR | Status: AC
  Filled 2017-12-18: qty 20

## 2017-12-18 MED ORDER — ASPIRIN EC 81 MG PO TBEC: 81.0000 mg | DELAYED_RELEASE_TABLET | Freq: Every day | ORAL | Status: DC

## 2017-12-18 MED ORDER — FENTANYL CITRATE (PF) 100 MCG/2ML IJ SOLN
INTRAMUSCULAR | Status: DC | PRN
  Administered 2017-12-18: 50 ug via INTRAVENOUS

## 2017-12-18 MED ORDER — SODIUM CHLORIDE 0.9 % IV SOLN
INTRAVENOUS | Status: DC | PRN
  Administered 2017-12-18: 100 mL/h via INTRAVENOUS

## 2017-12-18 MED ORDER — INFLUENZA VAC SPLIT HIGH-DOSE 0.5 ML IM SUSY: 0.5000 mL | PREFILLED_SYRINGE | INTRAMUSCULAR | Status: DC

## 2017-12-18 MED ORDER — ASPIRIN 81 MG PO TBEC: 81.0000 mg | DELAYED_RELEASE_TABLET | Freq: Every day | ORAL | Status: DC

## 2017-12-18 MED ORDER — NITROGLYCERIN 0.4 MG SL SUBL: 0.4000 mg | SUBLINGUAL_TABLET | SUBLINGUAL | Status: DC | PRN

## 2017-12-18 MED ORDER — SODIUM CHLORIDE 0.9 % IV SOLN: INTRAVENOUS | Status: AC

## 2017-12-18 MED ORDER — IOPAMIDOL (ISOVUE-370) INJECTION 76%
INTRAVENOUS | Status: AC
  Filled 2017-12-18: qty 125

## 2017-12-18 MED ORDER — COLCHICINE 0.6 MG PO TABS
0.6000 mg | ORAL_TABLET | Freq: Two times a day (BID) | ORAL | Status: DC
  Administered 2017-12-18: 0.6 mg via ORAL
  Filled 2017-12-18: qty 1

## 2017-12-18 MED ORDER — IOPAMIDOL (ISOVUE-370) INJECTION 76%
INTRAVENOUS | Status: DC | PRN
  Administered 2017-12-18: 125 mL via INTRA_ARTERIAL

## 2017-12-18 MED ORDER — PANTOPRAZOLE SODIUM 40 MG PO TBEC: 40.0000 mg | DELAYED_RELEASE_TABLET | Freq: Every day | ORAL | 1 refills | Status: DC

## 2017-12-18 MED ORDER — NITROGLYCERIN 1 MG/10 ML FOR IR/CATH LAB
INTRA_ARTERIAL | Status: AC
  Filled 2017-12-18: qty 10

## 2017-12-18 SURGICAL SUPPLY — 13 items
CATH INFINITI 5 FR JL3.5 (CATHETERS) ×2 IMPLANT
CATH INFINITI 5FR ANG PIGTAIL (CATHETERS) ×2 IMPLANT
CATH INFINITI JR4 5F (CATHETERS) ×2 IMPLANT
DEVICE RAD COMP TR BAND LRG (VASCULAR PRODUCTS) ×2 IMPLANT
GLIDESHEATH SLEND SS 6F .021 (SHEATH) ×2 IMPLANT
GUIDEWIRE INQWIRE 1.5J.035X260 (WIRE) ×1 IMPLANT
INQWIRE 1.5J .035X260CM (WIRE) ×2
KIT ENCORE 26 ADVANTAGE (KITS) ×2 IMPLANT
KIT HEART LEFT (KITS) ×2 IMPLANT
PACK CARDIAC CATHETERIZATION (CUSTOM PROCEDURE TRAY) ×2 IMPLANT
SYR MEDRAD MARK V 150ML (SYRINGE) ×2 IMPLANT
TRANSDUCER W/STOPCOCK (MISCELLANEOUS) ×2 IMPLANT
TUBING CIL FLEX 10 FLL-RA (TUBING) ×2 IMPLANT

## 2017-12-18 NOTE — ED Notes (Addendum)
Dr Angelena Form at bedside

## 2017-12-18 NOTE — Plan of Care (Signed)
  Progressing Clinical Measurements: Will remain free from infection 12/18/2017 0500 - Progressing by Colonel Bald, RN Note No s/s of infection noted. Respiratory complications will improve 12/18/2017 0500 - Progressing by Colonel Bald, RN Note No s/s of respiratory complications. Cardiovascular complication will be avoided 12/18/2017 0500 - Progressing by Colonel Bald, RN Note No s/s of cardiovascular complications noted.

## 2017-12-18 NOTE — Progress Notes (Signed)
  Echocardiogram 2D Echocardiogram has been performed.  Hannia Matchett G Raveena Hebdon 12/18/2017, 11:04 AM

## 2017-12-18 NOTE — Discharge Summary (Addendum)
Discharge Summary    Patient ID: Jacob Le,  MRN: [id number], DOB/AGE: 68/21/1952 68 y.o.  Admit date: 12/17/2017 Discharge date: 12/18/2017  Primary Care Provider: Elby Showers Primary Cardiologist: Dr. Angelena Form   Discharge Diagnoses    Principal Problem:   Acute pericarditis Active Problems:   STEMI (ST elevation myocardial infarction) [facility])   Chest pain   Second degree AV block, Mobitz type I  Allergies Allergies  Allergen Reactions  . Codeine Nausea And Vomiting    Diagnostic Studies/Procedures    Echo 12/18/17 Study Conclusions  - Left ventricle: The cavity size was normal. Wall thickness was   normal. Systolic function was normal. The estimated ejection   fraction was in the range of 60% to 65%. Wall motion was normal;   there were no regional wall motion abnormalities. Features are   consistent with a pseudonormal left ventricular filling pattern,   with concomitant abnormal relaxation and increased filling   pressure (grade 2 diastolic dysfunction). - Aortic valve: There was mild regurgitation. - Mitral valve: There was mild to moderate regurgitation directed   centrally. Diastolic regurgitation was present, consistent with   AV dyssynchrony. - Left atrium: The atrium was moderately dilated. - Right atrium: The atrium was mildly dilated. - Pulmonary arteries: Systolic pressure was mildly increased. PA   peak pressure: 34 mm Hg (S).  Impressions:  - The rhythm throughout the study appears to be sinus with second   degree AV block, Mobitz type 1.   LEFT HEART CATH AND CORONARY ANGIOGRAPHY 12/18/17  Conclusion     Prox RCA to Mid RCA lesion is 40% stenosed.  Mid RCA lesion is 20% stenosed.  Ost Cx to Prox Cx lesion is 30% stenosed.  Prox Cx to Dist Cx lesion is 20% stenosed.  Prox LAD lesion is 40% stenosed.  Prox LAD to Mid LAD lesion is 40% stenosed.  The left ventricular systolic function is normal.  LV end diastolic pressure is  normal.  The left ventricular ejection fraction is 55-65% by visual estimate.  There is no mitral valve regurgitation.  1. 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 Normal LV systolic function  Recommendations: He will be admitted to telemetry. Echo later today. Will start ASA, statin and beta blocker.        History of Present Illness     67 y.o. male with no significant pastmedical historypresents with acute complaints of chest discomfort with respiration. He states his symptoms started approximately 3:00 pm 12/17/17.  He was sitting in the chair not doing any particular activity when it started.  Pain is described as sharp at times just below his sternum.  Increased with deep breathing as well as palpation of the upper sternum.  Continues to have mild dyspnea.  Blood pressure is quite elevated at 200/125.  ECG with 1 mm ST segment elevation in the inferior leads and Mobitz I Second degree AV block.  He has a previous history of Mobitz I.      Hospital Course     Consultants: None   1. Acute pericarditis - The patient was taken to cath lab emergently due to diffuse inferior ST elevation. Cath showed moderate non-obstructive disease in the RCA, Circumflex and LAD. Normal LVEF and LVEDP. Pending echo. Troponin has been negative. TSH normal. No recurrent chest pressure. However, he continues to have pleuritic chest pain. No cough, congestion and fever. Wife was sick 2 weeks ago. No recent travel or hx of  blood clot. His pleuritic chest pain improved after IV Toradol. Echo with normal LVEF. He is started on ibuprofen 800mg  TID x 7 days and colchicine 0.6mg  BID x 10 days. Given PPI to prevent GI upset.  12/17/2017: Cholesterol 210; HDL 53; LDL Cholesterol 98; Triglycerides 293; VLDL 59  - given on obstructive CAD, he will continue ASA 81mg  and lipitor 40mg  qd. Repeat labs in 6 weeks.   2. Second degree heart block type I, Wenkebach phenomenon - This was discovered  previously during Medicare wellness EKG and Holter monitor.  This should be benign if he is not having any syncope. Stopped his metoprolol (only received one dose).   The patient has been seen by Dr. Marlou Porch today and deemed ready for discharge home. All follow-up appointments have been scheduled. Discharge medications are listed below.   Discharge Vitals Blood pressure 118/69, pulse 65, temperature 98.1 F (36.7 C), temperature source Oral, resp. rate 19, height 5\' 9"  (1.753 m), weight 217 lb 2.5 oz (98.5 kg), SpO2 96 %.  Filed Weights   12/17/17 2346 12/17/17 2354 12/18/17 0458  Weight: 208 lb (94.3 kg) 210 lb (95.3 kg) 217 lb 2.5 oz (98.5 kg)    Labs & Radiologic Studies     CBC Recent Labs    12/17/17 2343 12/18/17 0733  WBC 10.8* 8.9  NEUTROABS 6.6  --   HGB 16.3 14.1  HCT 46.8 41.2  MCV 89.0 88.6  PLT 259 502   Basic Metabolic Panel Recent Labs    12/17/17 2343 12/18/17 0144 12/18/17 0733  NA 141  --  139  K 4.4  --  4.0  CL 107  --  112*  CO2 21*  --  18*  GLUCOSE 134*  --  125*  BUN 36*  --  30*  CREATININE 1.64*  --  1.25*  CALCIUM 9.7  --  8.7*  MG  --  1.9  --    Liver Function Tests Recent Labs    12/17/17 2343 12/18/17 0144  AST 35 31  ALT 41 37  ALKPHOS 75 63  BILITOT 1.1 0.7  PROT 7.3 6.2*  ALBUMIN 4.7 3.9   No results for input(s): LIPASE, AMYLASE in the last 72 hours. Cardiac Enzymes Recent Labs    12/17/17 2343 12/18/17 0144 12/18/17 0733  TROPONINI <0.03 <0.03 <0.03   BNP Invalid input(s): POCBNP D-Dimer No results for input(s): DDIMER in the last 72 hours. Hemoglobin A1C Recent Labs    12/18/17 0144  HGBA1C 5.7*   Fasting Lipid Panel Recent Labs    12/17/17 2344  CHOL 210*  HDL 53  LDLCALC 98  TRIG 293*  CHOLHDL 4.0   Thyroid Function Tests Recent Labs    12/18/17 0144  TSH 1.950    Dg Chest Portable 1 View  Result Date: 12/18/2017 CLINICAL DATA:  67 y/o  M: chest pain, code STEMI EXAM: PORTABLE CHEST 1  VIEW COMPARISON:  None. FINDINGS: The heart size and mediastinal contours are within normal limits. Both lungs are clear. The visualized skeletal structures are unremarkable. IMPRESSION: No active disease. Electronically Signed   By: Ulyses Jarred M.D.   On: 12/18/2017 00:57    Disposition   Pt is being discharged home today in good condition.  Follow-up Plans & Appointments    Follow-up Information    [facility], [place], [place]. Go on 01/07/2018.   Specialty:  Cardiology Why:  @8am  for hospital follow up  Contact information: [place] [phone]  [phone]          Discharge Instructions    Diet - low sodium heart healthy   Complete by:  As directed    Discharge instructions   Complete by:  As directed    No driving for 48 hours. No lifting over 5 lbs for 1 week. No sexual activity for 1 week. You may return to work on 12/24/16. Keep procedure site clean & dry. If you notice increased pain, swelling, bleeding or pus, call/return!  You may shower, but no soaking baths/hot tubs/pools for 1 week.   Increase activity slowly   Complete by:  As directed       Discharge Medications   Allergies as of 12/18/2017      Reactions   Codeine Nausea And Vomiting      Medication List    TAKE these medications   aspirin 81 MG EC tablet Take 1 tablet (81 mg total) by mouth daily. Start taking on:  12/19/2017   atorvastatin 40 MG tablet Commonly known as:  LIPITOR Take 1 tablet (40 mg total) by mouth daily at 6 PM.   colchicine 0.6 MG tablet Take 1 tablet (0.6 mg total) by mouth 2 (two) times daily.   ibuprofen 800 MG tablet Commonly known as:  ADVIL,MOTRIN Take 1 tablet (800 mg total) by mouth 3 (three) times daily with meals.   pantoprazole 40 MG tablet Commonly known as:  PROTONIX Take 1 tablet (40 mg total) by mouth daily.         Outstanding Labs/Studies   Consider OP f/u labs 6-8 weeks given statin initiation this admission.  Duration of  Discharge Encounter   Greater than 30 minutes including physician time.  Signed, Jacob Kail PA-C 12/18/2017, 4:51 PM    Personally seen and examined. Agree with above.  67 year old with chest/pleuritic type chest pain worse with deep breathing, mostly in the epigastric region currently but started in his left neck, shoulder improved with sitting upright with arms slightly elevated, no fevers, no syncope, no recent chills, nausea, diarrhea, vomiting.  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  No recent decrease in exercise tolerance.  His wife did have a cold about 2 weeks ago which he did not get.  Exam: Regular rate and rhythm with 1/6 systolic murmur heard best at apex, holosystolic likely mitral regurgitation, no JVD, no edema, no rashes, alert, lungs are clear but he does have some hesitancy with deep inspiration.  Labs: EKG demonstrates diffuse J-point elevation ST elevation consistent with pericarditis. Telemetry: Occasional second degree heart block type I and occasional 2-1 AV nodal conduction, first-degree AV block as well.  No significant pauses.  Holter monitor on 12/12/16 showed similar findings as on current telemetry.  Acute pericarditis (this is not a STEMI) -We will treat with anti-inflammatories.  I will give him IV Toradol then schedule ibuprofen 800 mg 3 times a day.  We will give him Protonix.  I will also give him colchicine 0.6 mg twice a day. -Echocardiogram pending.  Try to avoid steroids in the setting of pericarditis as this can lead to repeat pericarditis. Trop neg  Second degree heart block type I, Wenkebach phenomenon - This was discovered previously during Medicare wellness EKG and Holter monitor.  This should be benign if he is not having any syncope.  Agree with stopping his metoprolol.  Interestingly - SED rate is normal. This is unusual in pericarditis.   Candee Furbish, MD

## 2017-12-18 NOTE — H&P (Signed)
CARDIOLOGY HISTORY AND PHYSICAL     Date: 12/18/2017 Admitting Physician: Veryl Speak, MD Chief Complaint:  STEMI ____________________________________________________________________ History of Present Illness:  Jacob Le is a 67 y.o. old male with no significant past medical history presents with acute complaints of chest discomfort with respiration.  He states his symptoms started approximately 3:00 pm today.  He was sitting in the chair not doing any particular activity when it started.  Pain is described as sharp at times just below his sternum.  Increased with deep breathing as well as palpation of the upper sternum.  Continues to have mild dyspnea on exam.  Blood pressure is quite elevated at 200/125.  ECG with 1 mm ST segment elevation in the inferior leads and Mobitz I Second degree AV block.  He has a previous history of Mobitz I.      Review of Systems:   Review of Systems:  GEN: no fever, chills, nausea, vomiting, weight change  HEENT: no vision or hearing changes  PULM: no coughing, +SOB  CV: +chest pain, palpitations, PND, orthopnea  GI: no abdominal pain  GU: no dysuria  EXT: no swelling  SKIN: no rashes  NEURO: no numbness or tingling  HEME: no bleeding or bruising  GYN: none  --12 point review systems- otherwise negative.  All other systems reviewed and are negative  Denies past medical history  Past Surgical History:  Procedure Laterality Date  . broken left shoulder    . pancreatis attack    . torn right rotator cuff      Social History   Socioeconomic History  . Marital status: Married    Spouse name: Not on file  . Number of children: Not on file  . Years of education: Not on file  . Highest education level: Not on file  Social Needs  . Financial resource strain: Not on file  . Food insecurity - worry: Not on file  . Food insecurity - inability: Not on file  . Transportation needs - medical: Not on file  . Transportation needs - non-medical:  Not on file  Occupational History  . Not on file  Tobacco Use  . Smoking status: Never Smoker  . Smokeless tobacco: Never Used  Substance and Sexual Activity  . Alcohol use: Yes    Comment: 4oz weekly   . Drug use: Not on file  . Sexual activity: Not on file  Other Topics Concern  . Not on file  Social History Narrative  . Not on file    Family History  Problem Relation Age of Onset  . Cancer Mother     Past Cardiovascular History:  - No documented h/o CAD - No documented h/o MI - No documented h/o CHF - No documented h/o PVD - No documented h/o AAA - No documented h/o valvular heart disease - No documented h/o CVA - No documented h/o Arrhythmias - No documented h/o A-fib  - No documented h/o congenital heart disease - No documented h/o CABG - No documented h/o PCI - No documented h/o cardiac devices (Pacer/ICD/CRT) - No documented h/o cardiac surgery       Most recent stress test:  None  Most recent echocardiography:  None  Most recent left heart catheterization:  None  CABG:  Date/ Physician: None  Device history:  None  Prior to Admission medications   Not on File    Allergies  Allergen Reactions  . Codeine Nausea And Vomiting    Social History:   Social  History   Tobacco Use  . Smoking status: Never Smoker  . Smokeless tobacco: Never Used  Substance Use Topics  . Alcohol use: Yes    Comment: 4oz weekly   . Drug use: Not on file    Family History  Problem Relation Age of Onset  . Cancer Mother     Physical Examination: Blood pressure (!) 199/110, pulse (!) 108, temperature 98.1 F (36.7 C), temperature source Oral, resp. rate 17, height 5\' 9"  (1.753 m), weight 95.3 kg (210 lb), SpO2 98 %. General:  AAOX 4.  NAD.  NRD.  HENT: Normocephalic. Atraumatic.  No acute abnom. EYES: PERRL EOMI  Neck: Supple.  No JVD.  No bruits. Cardiovascular:  Nl S1. Nl S2. No S3. No S4. Nl PMI. No m/r/c. RRR  Pulmonary/Chest: CTA B. No rales. No  wheezing.  Abdomen: Soft, NT, no masses, no organomegaly. Neuro: CN intact, no motor/sensory deficit.  Ext: Warm. No edema.  SKIN- intact  No intake or output data in the 24 hours ending 12/18/17 0011  Troponin (Point of Care Test) No results for input(s): TROPIPOC in the last 72 hours. ____________________________________________________________________ Assessment/Plan  STEMI   Assessment:  Patient presents with inferior STEMI.  Chest pain stable.     Plan  -  To the lab  -  ECHO  -  Beta blockade  -  ACE  -  Further recommendations to follow  HTN   Assessment:  Needs adequate blood pressure control.     Plan  -  Metoprolol 12.5 mg  -  Lisinopril 10 mg  Thank you for consulting cardiology.    Electronically signed by Lowella Dandy 12/18/2017 Baruch Merl, MD, PhD Cardiology

## 2017-12-18 NOTE — ED Notes (Signed)
To Cath lab 

## 2017-12-18 NOTE — Discharge Instructions (Addendum)
Radial Site Care Refer to this sheet in the next few weeks. These instructions provide you with information about caring for yourself after your procedure. Your health care provider may also give you more specific instructions. Your treatment has been planned according to current medical practices, but problems sometimes occur. Call your health care provider if you have any problems or questions after your procedure. What can I expect after the procedure? After your procedure, it is typical to have the following:  Bruising at the radial site that usually fades within 1-2 weeks.  Blood collecting in the tissue (hematoma) that may be painful to the touch. It should usually decrease in size and tenderness within 1-2 weeks.  Follow these instructions at home:  Take medicines only as directed by your health care provider.  You may shower 24-48 hours after the procedure or as directed by your health care provider. Remove the bandage (dressing) and gently wash the site with plain soap and water. Pat the area dry with a clean towel. Do not rub the site, because this may cause bleeding.  Do not take baths, swim, or use a hot tub until your health care provider approves.  Check your insertion site every day for redness, swelling, or drainage.  Do not apply powder or lotion to the site.  Do not flex or bend the affected arm for 24 hours or as directed by your health care provider.  Do not push or pull heavy objects with the affected arm for 24 hours or as directed by your health care provider.  Do not lift over 10 lb (4.5 kg) for 5 days after your procedure or as directed by your health care provider.  Ask your health care provider when it is okay to: ? Return to work or school. ? Resume usual physical activities or sports. ? Resume sexual activity.  Do not drive home if you are discharged the same day as the procedure. Have someone else drive you.  You may drive 24 hours after the procedure  unless otherwise instructed by your health care provider.  Do not operate machinery or power tools for 24 hours after the procedure.  If your procedure was done as an outpatient procedure, which means that you went home the same day as your procedure, a responsible adult should be with you for the first 24 hours after you arrive home.  Keep all follow-up visits as directed by your health care provider. This is important. Contact a health care provider if:  You have a fever.  You have chills.  You have increased bleeding from the radial site. Hold pressure on the site. Get help right away if:  You have unusual pain at the radial site.  You have redness, warmth, or swelling at the radial site.  You have drainage (other than a small amount of blood on the dressing) from the radial site.  The radial site is bleeding, and the bleeding does not stop after 30 minutes of holding steady pressure on the site.  Your arm or hand becomes pale, cool, tingly, or numb. This information is not intended to replace advice given to you by your health care provider. Make sure you discuss any questions you have with your health care provider. Document Released: 11/04/2010 Document Revised: 03/09/2016 Document Reviewed: 04/20/2014 Elsevier Interactive Patient Education  2018 [employer].   Chest Wall Pain Chest wall pain is pain in or around the bones and muscles of your chest. Sometimes, an injury causes this pain.  Sometimes, the cause may not be known. This pain may take several weeks or longer to get better. Follow these instructions at home: Pay attention to any changes in your symptoms. Take these actions to help with your pain:  Rest as told by your doctor.  Avoid activities that cause pain. Try not to use your chest, belly (abdominal), or side muscles to lift heavy things.  If directed, apply ice to the painful area: ? Put ice in a plastic bag. ? Place a towel between your skin and the  bag. ? Leave the ice on for 20 minutes, 2-3 times per day.  Take over-the-counter and prescription medicines only as told by your doctor.  Do not use tobacco products, including cigarettes, chewing tobacco, and e-cigarettes. If you need help quitting, ask your doctor.  Keep all follow-up visits as told by your doctor. This is important.  Contact a doctor if:  You have a fever.  Your chest pain gets worse.  You have new symptoms. Get help right away if:  You feel sick to your stomach (nauseous) or you throw up (vomit).  You feel sweaty or light-headed.  You have a cough with phlegm (sputum) or you cough up blood.  You are short of breath. This information is not intended to replace advice given to you by your health care provider. Make sure you discuss any questions you have with your health care provider. Document Released: 03/20/2008 Document Revised: 03/09/2016 Document Reviewed: 12/28/2014 Elsevier Interactive Patient Education  [employer].

## 2017-12-18 NOTE — Progress Notes (Signed)
Pt ambulated on a hall way without dizziness or SOB. Steady gait. Pulse Ox remained greater than 98%.  Idolina Primer, RN

## 2017-12-18 NOTE — Progress Notes (Addendum)
Progress Note  Patient Name: Jacob Le Date of Encounter: 12/18/2017  Primary Cardiologist: New to Dr. Angelena Form  Subjective   No chest pressure. Epigastric/lower external chest discomfort with deep breath and movement. No cough or congestion.   Inpatient Medications    Scheduled Meds: . [START ON 12/19/2017] aspirin EC  81 mg Oral Daily  . atorvastatin  80 mg Oral q1800  . [START ON 12/19/2017] Influenza vac split quadrivalent PF  0.5 mL Intramuscular Tomorrow-1000  . metoprolol tartrate  12.5 mg Oral BID  . sodium chloride flush  3 mL Intravenous Q12H  . traZODone  100 mg Oral QHS   Continuous Infusions: . sodium chloride     PRN Meds: sodium chloride, acetaminophen, nitroGLYCERIN, ondansetron (ZOFRAN) IV, sodium chloride flush   Vital Signs    Vitals:   12/18/17 0328 12/18/17 0358 12/18/17 0428 12/18/17 0458  BP: 108/67 105/60 (!) 107/54 107/61  Pulse: 68 67 68 65  Resp: 15 15 15 19   Temp:    (!) 97.1 F (36.2 C)  TempSrc:    Oral  SpO2: 97% 97% 97% 96%  Weight:    217 lb 2.5 oz (98.5 kg)  Height:        Intake/Output Summary (Last 24 hours) at 12/18/2017 0736 Last data filed at 12/18/2017 0530 Gross per 24 hour  Intake 465 ml  Output -  Net 465 ml   Filed Weights   12/17/17 2346 12/17/17 2354 12/18/17 0458  Weight: 208 lb (94.3 kg) 210 lb (95.3 kg) 217 lb 2.5 oz (98.5 kg)    Telemetry    SR with rate of 50-60s. Mobitz I however some strip concerning to TYpe II- Personally Reviewed  ECG    SR with resolving ST elevation, Mobitz I  - Personally Reviewed  Physical Exam   GEN: No acute distress.   Neck: No JVD Cardiac: RRR, no murmurs, rubs, or gallops. R radial cath site stable.  Respiratory: Clear to auscultation bilaterally. GI: Soft, nontender, non-distended  MS: No edema; No deformity. Neuro:  Nonfocal  Psych: Normal affect   Labs    Chemistry Recent Labs  Lab 12/17/17 2343 12/18/17 0144  NA 141  --   K 4.4  --   CL 107  --   CO2  21*  --   GLUCOSE 134*  --   BUN 36*  --   CREATININE 1.64*  --   CALCIUM 9.7  --   PROT 7.3 6.2*  ALBUMIN 4.7 3.9  AST 35 31  ALT 41 37  ALKPHOS 75 63  BILITOT 1.1 0.7  GFRNONAA 42*  --   GFRAA 48*  --   ANIONGAP 13  --      Hematology Recent Labs  Lab 12/17/17 2343  WBC 10.8*  RBC 5.26  HGB 16.3  HCT 46.8  MCV 89.0  MCH 31.0  MCHC 34.8  RDW 13.2  PLT 259    Cardiac Enzymes Recent Labs  Lab 12/17/17 2343 12/18/17 0144  TROPONINI <0.03 <0.03   No results for input(s): TROPIPOC in the last 168 hours.    Radiology    Dg Chest Portable 1 View  Result Date: 12/18/2017 CLINICAL DATA:  67 y/o  M: chest pain, code STEMI EXAM: PORTABLE CHEST 1 VIEW COMPARISON:  None. FINDINGS: The heart size and mediastinal contours are within normal limits. Both lungs are clear. The visualized skeletal structures are unremarkable. IMPRESSION: No active disease. Electronically Signed   By: Cletus Gash.D.  On: 12/18/2017 00:57    Cardiac Studies   LEFT HEART CATH AND CORONARY ANGIOGRAPHY  Conclusion     Prox RCA to Mid RCA lesion is 40% stenosed.  Mid RCA lesion is 20% stenosed.  Ost Cx to Prox Cx lesion is 30% stenosed.  Prox Cx to Dist Cx lesion is 20% stenosed.  Prox LAD lesion is 40% stenosed.  Prox LAD to Mid LAD lesion is 40% stenosed.  The left ventricular systolic function is normal.  LV end diastolic pressure is normal.  The left ventricular ejection fraction is 55-65% by visual estimate.  There is no mitral valve regurgitation.   1. 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 Normal LV systolic function  Recommendations: He will be admitted to telemetry. Echo later today. Will start ASA, statin and beta blocker.      Patient Profile     67 y.o. male with no significant past medical history presents with acute complaints of chest discomfort with respiration. ECG with 1 mm ST segment elevation in the inferior leads and  Mobitz I Second degree AV block.  He has a previous history of Mobitz I.  Cath with non obstructive CAD.   Assessment & Plan    1. Inferior STEMI - cath showed moderate non-obstructive disease in the RCA, Circumflex and LAD. Normal LVEF and LVEDP. Pending echo. Troponin has been negative. TSH normal.  - His chest pressure has been resolved. However, he continues to have pleuritic chest pain. No cough, congestion and fever. Wife was sick 2 weeks ago. No recent travel or hx of blood clot.  - ? His symptoms due to pericarditis.  12/17/2017: Cholesterol 210; HDL 53; LDL Cholesterol 98; Triglycerides 293; VLDL 59  - Continue ASA, statin and BB.   2. Mobitz I Second degree AV block - some strip on telemetry concerning for Mobitz II. Will review with MD.   3. Elevated BP - BP was elevated on presentation. Currently stable on BB. Given bradycardia may need to consider changing to another agent.   For questions or updates, please contact [place] Please consult [email or web address] for contact info under Cardiology/STEMI.      SignedLeanor Kail, PA  12/18/2017, 7:36 AM    Personally seen and examined. Agree with above.  67 year old with chest/pleuritic type chest pain worse with deep breathing, mostly in the epigastric region currently but started in his left neck, shoulder improved with sitting upright with arms slightly elevated, no fevers, no syncope, no recent chills, nausea, diarrhea, vomiting.  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  No recent decrease in exercise tolerance.  His wife did have a cold about 2 weeks ago which he did not get.  Exam: Regular rate and rhythm with 1/6 systolic murmur heard best at apex, holosystolic likely mitral regurgitation, no JVD, no edema, no rashes, alert, lungs are clear but he does have some hesitancy with deep inspiration.  Labs: EKG demonstrates diffuse J-point elevation ST elevation consistent with  pericarditis. Telemetry: Occasional second degree heart block type I and occasional 2-1 AV nodal conduction, first-degree AV block as well.  No significant pauses.  Holter monitor on 12/12/16 showed similar findings as on current telemetry.  Acute pericarditis (this is not a STEMI) -We will treat with anti-inflammatories.  I will give him IV Toradol then schedule ibuprofen 800 mg 3 times a day.  We will give him Protonix.  I will also give him colchicine 0.6 mg twice  a day. -Echocardiogram pending.  Try to avoid steroids in the setting of pericarditis as this can lead to repeat pericarditis. Trop neg  Second degree heart block type I, Wenkebach phenomenon - This was discovered previously during Medicare wellness EKG and Holter monitor.  This should be benign if he is not having any syncope.  Agree with stopping his metoprolol.  If he is feeling better later on today and echocardiogram is unremarkable and is not showing any significant adverse arrhythmia such as ventricular tachycardia, I would be comfortable with his discharge later today.  Candee Furbish, MD

## 2017-12-28 ENCOUNTER — Encounter: Payer: Self-pay | Admitting: Physician Assistant

## 2018-01-07 ENCOUNTER — Ambulatory Visit: Payer: Medicare Other | Admitting: Physician Assistant

## 2018-01-07 VITALS — BP 118/70 | HR 80 | Ht 69.0 in | Wt 213.0 lb

## 2018-01-07 DIAGNOSIS — I2510 Atherosclerotic heart disease of native coronary artery without angina pectoris: Secondary | ICD-10-CM

## 2018-01-07 DIAGNOSIS — E785 Hyperlipidemia, unspecified: Secondary | ICD-10-CM | POA: Diagnosis not present

## 2018-01-07 DIAGNOSIS — I319 Disease of pericardium, unspecified: Secondary | ICD-10-CM

## 2018-01-07 NOTE — Progress Notes (Signed)
Cardiology Office Note    Date:  01/07/2018   ID:  Jacob Le, DOB Mar 07, 1951, MRN [id number]  PCP:  Jacob Showers, MD  Cardiologist:  Dr. Angelena Form   Chief Complaint: Hospital follow up   History of Present Illness:   Jacob Le is a 67 y.o. male presents for hospital follow up.   He has no significant pastmedical historypresents with acute complaints of chest discomfort with respiration 12/17/17. The patient was taken to cath lab emergently due to diffuse inferior ST elevation. Cath showed moderate non-obstructive disease in the RCA, Circumflex and LAD. Normal LVEF and LVEDP. Troponin has been negative. TSH normal.No recurrent chest pressure. However, he continues to have pleuritic chest pain. No cough, congestion and fever. Wife was sick 2 weeks ago. His pleuritic chest pain improved after IV Toradol. Echo with normal LVEF. He is started on ibuprofen 800mg  TID x 7 days and colchicine 0.6mg  BID x 10 days. Given PPI to prevent GI upset.  Given on obstructive CAD, he will continue ASA 81mg  and lipitor 40mg  qd. 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 Avoid BB.   Here today for follow up.  No recurrent chest pain.  He is doing yard work and exercise without any chest pain or shortness of breath.  Compliant with medication.  Denies orthopnea, PND, syncope, lower extremity edema or melena.  Past Medical History:  Diagnosis Date  . Dysrhythmia    2nd degree heart block, Type 1  . Sleep apnea     Past Surgical History:  Procedure Laterality Date  . broken left shoulder    . LEFT HEART CATH AND CORONARY ANGIOGRAPHY N/A 12/18/2017   Procedure: LEFT HEART CATH AND CORONARY ANGIOGRAPHY;  Surgeon: Burnell Blanks, MD;  Location: [facility];  Service: Cardiovascular;  Laterality: N/A;  . pancreatis attack    . torn right rotator cuff      Current Medications: Prior to Admission medications   Medication Sig Start Date End Date Taking?  Authorizing Provider  aspirin EC 81 MG EC tablet Take 1 tablet (81 mg total) by mouth daily. 12/19/17   Ahmiya Abee, Crista Luria, PA  atorvastatin (LIPITOR) 40 MG tablet Take 1 tablet (40 mg total) by mouth daily at 6 PM. 12/18/17   Antwuan Eckley, PA  colchicine 0.6 MG tablet Take 1 tablet (0.6 mg total) by mouth 2 (two) times daily. 12/18/17   Idolina Mantell, Crista Luria, PA  ibuprofen (ADVIL,MOTRIN) 800 MG tablet Take 1 tablet (800 mg total) by mouth 3 (three) times daily with meals. 12/18/17   Aaliyah Gavel, Crista Luria, PA  pantoprazole (PROTONIX) 40 MG tablet Take 1 tablet (40 mg total) by mouth daily. 12/18/17 12/18/18  Leanor Kail, PA    Allergies:   Codeine   Social History   Socioeconomic History  . Marital status: Married    Spouse name: Not on file  . Number of children: Not on file  . Years of education: Not on file  . Highest education level: Not on file  Occupational History  . Not on file  Social Needs  . Financial resource strain: Not on file  . Food insecurity:    Worry: Not on file    Inability: Not on file  . Transportation needs:    Medical: Not on file    Non-medical: Not on file  Tobacco Use  . Smoking status: Never Smoker  . Smokeless tobacco: Never Used  Substance and Sexual Activity  . Alcohol use: Yes  Comment: 4oz weekly   . Drug use: No  . Sexual activity: Not on file  Lifestyle  . Physical activity:    Days per week: Not on file    Minutes per session: Not on file  . Stress: Not on file  Relationships  . Social connections:    Talks on phone: Not on file    Gets together: Not on file    Attends religious service: Not on file    Active member of club or organization: Not on file    Attends meetings of clubs or organizations: Not on file    Relationship status: Not on file  Other Topics Concern  . Not on file  Social History Narrative  . Not on file     Family History:  The patient's family history includes Cancer in his mother.   ROS:   Please see  the history of present illness.    ROS All other systems reviewed and are negative.   PHYSICAL EXAM:   VS:  BP 118/70   Pulse 80   Ht 5\' 9"  (1.753 m)   Wt 213 lb (96.6 kg)   BMI 31.45 kg/m    GEN: Well nourished, well developed, in no acute distress  HEENT: normal  Neck: no JVD, carotid bruits, or masses Cardiac: RRR; no murmurs, rubs, or gallops,no edema  Respiratory:  clear to auscultation bilaterally, normal work of breathing GI: soft, nontender, nondistended, + BS MS: no deformity or atrophy  Skin: warm and dry, no rash Neuro:  Alert and Oriented x 3, Strength and sensation are intact Psych: euthymic mood, full affect  Wt Readings from Last 3 Encounters:  01/07/18 213 lb (96.6 kg)  12/18/17 217 lb 2.5 oz (98.5 kg)  11/12/17 220 lb (99.8 kg)     Studies/Labs Reviewed:   EKG:  EKG is not ordered today.    Recent Labs: 12/18/2017: ALT 37; BUN 30; Creatinine, Ser 1.25; Hemoglobin 14.1; Magnesium 1.9; Platelets 206; Potassium 4.0; Sodium 139; TSH 1.950   Lipid Panel    Component Value Date/Time   CHOL 210 (H) 12/17/2017 2344   TRIG 293 (H) 12/17/2017 2344   HDL 53 12/17/2017 2344   CHOLHDL 4.0 12/17/2017 2344   VLDL 59 (H) 12/17/2017 2344   LDLCALC 98 12/17/2017 2344    Additional studies/ records that were reviewed today include:   Echo 12/18/17 Study Conclusions  - Left ventricle: The cavity size was normal. Wall thickness was normal. Systolic function was normal. The estimated ejection fraction was in the range of 60% to 65%. Wall motion was normal; there were no regional wall motion abnormalities. Features are consistent with a pseudonormal left ventricular filling pattern, with concomitant abnormal relaxation and increased filling pressure (grade 2 diastolic dysfunction). - Aortic valve: There was mild regurgitation. - Mitral valve: There was mild to moderate regurgitation directed centrally. Diastolic regurgitation was present, consistent  with AV dyssynchrony. - Left atrium: The atrium was moderately dilated. - Right atrium: The atrium was mildly dilated. - Pulmonary arteries: Systolic pressure was mildly increased. PA peak pressure: 34 mm Hg (S).  Impressions:  - The rhythm throughout the study appears to be sinus with second degree AV block, Mobitz type 1.    LEFT HEART CATH AND CORONARY ANGIOGRAPHY 12/18/17  Conclusion     Prox RCA to Mid RCA lesion is 40% stenosed.  Mid RCA lesion is 20% stenosed.  Ost Cx to Prox Cx lesion is 30% stenosed.  Prox Cx to Dist Cx  lesion is 20% stenosed.  Prox LAD lesion is 40% stenosed.  Prox LAD to Mid LAD lesion is 40% stenosed.  The left ventricular systolic function is normal.  LV end diastolic pressure is normal.  The left ventricular ejection fraction is 55-65% by visual estimate.  There is no mitral valve regurgitation.  1. 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 Normal LV systolic function  Recommendations: He will be admitted to telemetry. Echo later today. Will start ASA, statin and beta blocker.      ASSESSMENT & PLAN:    1. Pericarditis -No recurrent chest pain.  No need for long-term colchicine.  He will give [place] a call if further issue arises.  Continue exercise regimen.  2. Non obstructive CAD - Continue ASA and statin.  Will repeat lipid panel in 4-6 weeks.   Medication Adjustments/Labs and Tests Ordered: Current medicines are reviewed at length with the patient today.  Concerns regarding medicines are outlined above.  Medication changes, Labs and Tests ordered today are listed in the Patient Instructions below. Patient Instructions  Medication Instructions:     If you need a refill on your cardiac medications before your next appointment, please call your pharmacy.  Labwork: RETURN IN 6 WEEKS FOR FASTING LIPIDS  AND LFT IN 6 WEEKS    Testing/Procedures: NONE ORDERED  TODAY     Follow-Up:   Your physician wants you to follow-up in:  [place] will receive a reminder letter in the mail two months in advance. If you don't receive a letter, please call our office to schedule the follow-up appointment.     Any Other Special Instructions Will Be Listed Below (If Applicable).                                                                                                                                                      Mahalia Longest Learned, [place]  01/07/2018 8:29 AM    [facility] [address], [place], [place]  [address] Phone: [phone]; Fax: [phone]

## 2018-01-07 NOTE — Patient Instructions (Addendum)
Medication Instructions:   Your physician recommends that you continue on your current medications as directed. Please refer to the Current Medication list given to you today.   If you need a refill on your cardiac medications before your next appointment, please call your pharmacy.  Labwork: RETURN IN 6 WEEKS FOR FASTING LIPIDS  AND LFT IN 6 WEEKS    Testing/Procedures: NONE ORDERED  TODAY     Follow-Up:  Your physician wants you to follow-up in:  [place] will receive a reminder letter in the mail two months in advance. If you don't receive a letter, please call our office to schedule the follow-up appointment.     Any Other Special Instructions Will Be Listed Below (If Applicable).

## 2018-01-09 ENCOUNTER — Other Ambulatory Visit: Payer: Self-pay

## 2018-01-09 DIAGNOSIS — R1013 Epigastric pain: Secondary | ICD-10-CM | POA: Insufficient documentation

## 2018-01-09 DIAGNOSIS — Z7982 Long term (current) use of aspirin: Secondary | ICD-10-CM | POA: Diagnosis not present

## 2018-01-09 DIAGNOSIS — I252 Old myocardial infarction: Secondary | ICD-10-CM | POA: Diagnosis not present

## 2018-01-09 DIAGNOSIS — Z9861 Coronary angioplasty status: Secondary | ICD-10-CM | POA: Insufficient documentation

## 2018-01-09 DIAGNOSIS — Z79899 Other long term (current) drug therapy: Secondary | ICD-10-CM | POA: Insufficient documentation

## 2018-01-09 DIAGNOSIS — K859 Acute pancreatitis without necrosis or infection, unspecified: Secondary | ICD-10-CM | POA: Insufficient documentation

## 2018-01-10 ENCOUNTER — Emergency Department (HOSPITAL_COMMUNITY): Payer: Medicare Other

## 2018-01-10 ENCOUNTER — Emergency Department (HOSPITAL_COMMUNITY)
Admission: EM | Admit: 2018-01-10 | Discharge: 2018-01-10 | Disposition: A | Discharge status: Home / Self Care | Payer: Medicare Other | Attending: Emergency Medicine | Admitting: Emergency Medicine

## 2018-01-10 ENCOUNTER — Encounter (HOSPITAL_COMMUNITY): Payer: Self-pay | Admitting: Emergency Medicine

## 2018-01-10 DIAGNOSIS — R1013 Epigastric pain: Secondary | ICD-10-CM

## 2018-01-10 DIAGNOSIS — K859 Acute pancreatitis without necrosis or infection, unspecified: Secondary | ICD-10-CM

## 2018-01-10 LAB — COMPREHENSIVE METABOLIC PANEL
ALT: 170 U/L — AB (ref 17–63)
ANION GAP: 10 (ref 5–15)
AST: 170 U/L — ABNORMAL HIGH (ref 15–41)
Albumin: 4.3 g/dL (ref 3.5–5.0)
Alkaline Phosphatase: 93 U/L (ref 38–126)
BUN: 14 mg/dL (ref 6–20)
CO2: 22 mmol/L (ref 22–32)
Calcium: 9.4 mg/dL (ref 8.9–10.3)
Chloride: 105 mmol/L (ref 101–111)
Creatinine, Ser: 1.17 mg/dL (ref 0.61–1.24)
GFR calc non Af Amer: >60 mL/min (ref >60)
Glucose, Bld: 182 mg/dL — ABNORMAL HIGH (ref 65–99)
Potassium: 4.2 mmol/L (ref 3.5–5.1)
SODIUM: 137 mmol/L (ref 135–145)
Total Bilirubin: 1.8 mg/dL — ABNORMAL HIGH (ref 0.3–1.2)
Total Protein: 7 g/dL (ref 6.5–8.1)

## 2018-01-10 LAB — URINALYSIS, ROUTINE W REFLEX MICROSCOPIC
BILIRUBIN URINE: NEGATIVE
Glucose, UA: NEGATIVE mg/dL
Hgb urine dipstick: NEGATIVE
KETONES UR: NEGATIVE mg/dL
LEUKOCYTES UA: NEGATIVE
NITRITE: NEGATIVE
PROTEIN: NEGATIVE mg/dL
Specific Gravity, Urine: 1.016 (ref 1.005–1.030)
pH: 5 (ref 5.0–8.0)

## 2018-01-10 LAB — CBC
HEMATOCRIT: 43.9 % (ref 39.0–52.0)
HEMOGLOBIN: 15 g/dL (ref 13.0–17.0)
MCH: 30.1 pg (ref 26.0–34.0)
MCHC: 34.2 g/dL (ref 30.0–36.0)
MCV: 88.2 fL (ref 78.0–100.0)
Platelets: 255 10*3/uL (ref 150–400)
RBC: 4.98 MIL/uL (ref 4.22–5.81)
RDW: 13.6 % (ref 11.5–15.5)
WBC: 6.9 10*3/uL (ref 4.0–10.5)

## 2018-01-10 LAB — I-STAT TROPONIN, ED: TROPONIN I, POC: 0 ng/mL (ref 0.00–0.08)

## 2018-01-10 LAB — LIPASE, BLOOD: Lipase: 38 U/L (ref 11–51)

## 2018-01-10 MED ORDER — ONDANSETRON 4 MG PO TBDP: 4.0000 mg | ORAL_TABLET | ORAL | 0 refills | Status: DC | PRN

## 2018-01-10 MED ORDER — PANTOPRAZOLE SODIUM 20 MG PO TBEC: 20.0000 mg | DELAYED_RELEASE_TABLET | Freq: Every day | ORAL | 0 refills | Status: DC

## 2018-01-10 MED ORDER — IOPAMIDOL (ISOVUE-370) INJECTION 76%
INTRAVENOUS | Status: AC
  Administered 2018-01-10: 100 mL
  Filled 2018-01-10: qty 100

## 2018-01-10 MED ORDER — OXYCODONE-ACETAMINOPHEN 5-325 MG PO TABS: 1.0000 | ORAL_TABLET | Freq: Four times a day (QID) | ORAL | 0 refills | Status: DC | PRN

## 2018-01-10 MED ORDER — MORPHINE SULFATE (PF) 4 MG/ML IV SOLN
4.0000 mg | Freq: Once | INTRAVENOUS | Status: AC
  Administered 2018-01-10: 4 mg via INTRAVENOUS
  Filled 2018-01-10 (×2): qty 1

## 2018-01-10 MED ORDER — ONDANSETRON HCL 4 MG/2ML IJ SOLN
4.0000 mg | Freq: Once | INTRAMUSCULAR | Status: AC
  Administered 2018-01-10: 4 mg via INTRAVENOUS
  Filled 2018-01-10: qty 2

## 2018-01-10 MED ORDER — PANTOPRAZOLE SODIUM 40 MG IV SOLR
40.0000 mg | Freq: Once | INTRAVENOUS | Status: AC
  Administered 2018-01-10: 40 mg via INTRAVENOUS
  Filled 2018-01-10: qty 40

## 2018-01-10 NOTE — ED Provider Notes (Signed)
[facility] EMERGENCY DEPARTMENT Provider Note   CSN: [id number] Arrival date & time: 01/09/18  2358     History   Chief Complaint Chief Complaint  Patient presents with  . Abdominal Pain    HPI Jacob Le is a 67 y.o. male.  HPI Reports he started developing epigastric pain about 2 days ago.  It was aching and gradual in onset.  Patient thought he might be developing pancreatitis.  He had a couple of episodes several years ago.  His gallbladder was removed and he never had a problem since.  He reports last night the pain got worse and moved up between his shoulder blades as an intense aching pain.  No anterior chest pain or shortness of breath.  Patient had been taking Profen for pericarditis diagnosed 2-1/2 weeks ago.  He took it for the week after his discharge but has not been taking it this past week except sporadically.  Patient also has not been taking his PPI for the past week since he is not taking Lipitor (he typically takes a PPI if he takes Lipitor). Past Medical History:  Diagnosis Date  . Dysrhythmia    2nd degree heart block, Type 1  . Sleep apnea     Patient Active Problem List   Diagnosis Date Noted  . STEMI (ST elevation myocardial infarction) ([place]) 12/18/2017  . Chest pain 12/18/2017  . Acute pericarditis 12/18/2017  . Second degree AV block, Mobitz type I 12/18/2017  . Allergic rhinitis 11/12/2017  . OSA on CPAP 01/04/2017  . First degree AV block 12/12/2016    Past Surgical History:  Procedure Laterality Date  . broken left shoulder    . LEFT HEART CATH AND CORONARY ANGIOGRAPHY N/A 12/18/2017   Procedure: LEFT HEART CATH AND CORONARY ANGIOGRAPHY;  Surgeon: Burnell Blanks, MD;  Location: [facility];  Service: Cardiovascular;  Laterality: N/A;  . pancreatis attack    . pericarditis    . torn right rotator cuff          Home Medications    Prior to Admission medications   Medication Sig Start Date End Date  Taking? Authorizing Provider  aspirin EC 81 MG EC tablet Take 1 tablet (81 mg total) by mouth daily. 12/19/17   Bhagat, Crista Luria, PA  atorvastatin (LIPITOR) 40 MG tablet Take 1 tablet (40 mg total) by mouth daily at 6 PM. 12/18/17   Bhagat, Bhavinkumar, PA  ibuprofen (ADVIL,MOTRIN) 800 MG tablet Take 1 tablet (800 mg total) by mouth 3 (three) times daily with meals. 12/18/17   Bhagat, Crista Luria, PA  ondansetron (ZOFRAN ODT) 4 MG disintegrating tablet Take 1 tablet (4 mg total) by mouth every 4 (four) hours as needed for nausea or vomiting. 01/10/18   Charlesetta Shanks, MD  oxyCODONE-acetaminophen (PERCOCET) 5-325 MG tablet Take 1-2 tablets by mouth every 6 (six) hours as needed. 01/10/18   Charlesetta Shanks, MD  pantoprazole (PROTONIX) 20 MG tablet Take 1 tablet (20 mg total) by mouth daily. 01/10/18   Charlesetta Shanks, MD  pantoprazole (PROTONIX) 40 MG tablet Take 1 tablet (40 mg total) by mouth daily. 12/18/17 12/18/18  Leanor Kail, PA    Family History Family History  Problem Relation Age of Onset  . Cancer Mother     Social History Social History   Tobacco Use  . Smoking status: Never Smoker  . Smokeless tobacco: Never Used  Substance Use Topics  . Alcohol use: Yes    Comment: 4oz weekly   .  Drug use: No     Allergies   Codeine   Review of Systems Review of Systems 10 Systems reviewed and are negative for acute change except as noted in the HPI.  Physical Exam Updated Vital Signs BP 124/74 (BP Location: Right Arm)   Pulse 65   Temp 98.8 F (37.1 C) (Oral)   Resp 18   Ht 5\' 9"  (1.753 m)   Wt 95.3 kg (210 lb)   SpO2 98%   BMI 31.01 kg/m   Physical Exam  Constitutional: He is oriented to person, place, and time. He appears well-developed and well-nourished. No distress.  HENT:  Head: Normocephalic and atraumatic.  Eyes: Conjunctivae and EOM are normal.  Neck: Neck supple.  Cardiovascular: Normal rate, regular rhythm, normal heart sounds and intact distal pulses.    No murmur heard. Pulmonary/Chest: Effort normal and breath sounds normal. No respiratory distress.  Abdominal: Soft. He exhibits no distension and no mass. There is tenderness. There is no guarding.  Epigastric tenderness without guarding.  Musculoskeletal: Normal range of motion. He exhibits no edema or tenderness.  Dorsalis pedis pulses symmetric.  No peripheral edema or calf tenderness.  Neurological: He is alert and oriented to person, place, and time. No cranial nerve deficit. He exhibits normal muscle tone. Coordination normal.  Skin: Skin is warm and dry.  Psychiatric: He has a normal mood and affect.  Nursing note and vitals reviewed.    ED Treatments / Results  Labs (all labs ordered are listed, but only abnormal results are displayed) Labs Reviewed  COMPREHENSIVE METABOLIC PANEL - Abnormal; Notable for the following components:      Result Value   Glucose, Bld 182 (*)    AST 170 (*)    ALT 170 (*)    Total Bilirubin 1.8 (*)    All other components within normal limits  LIPASE, BLOOD  CBC  URINALYSIS, ROUTINE W REFLEX MICROSCOPIC  I-STAT TROPONIN, ED    EKG EKG Interpretation  Date/Time:  Thursday January 10 2018 09:02:49 EDT Ventricular Rate:  75 PR Interval:    QRS Duration: 82 QT Interval:  396 QTC Calculation: 443 R Axis:   68 Text Interpretation:  Sinus or ectopic atrial rhythm Prolonged PR interval Probable left atrial enlargement no acute ischemia. no sig change from previous Confirmed by Charlesetta Shanks [phone]) on 01/10/2018 9:05:44 AM   Radiology Ct Angio Chest/abd/pel For Dissection W And/or W/wo  Result Date: 01/10/2018 CLINICAL DATA:  67 year old male with acute chest and abdominal pain for 2 days. History of pancreatitis and pericarditis. EXAM: CT ANGIOGRAPHY CHEST, ABDOMEN AND PELVIS TECHNIQUE: Multidetector CT imaging through the chest, abdomen and pelvis was performed using the standard protocol during bolus administration of intravenous contrast.  Multiplanar reconstructed images and MIPs were obtained and reviewed to evaluate the vascular anatomy. CONTRAST:  18mL ISOVUE-370 IOPAMIDOL (ISOVUE-370) INJECTION 76% COMPARISON:  12/17/2017 chest radiograph FINDINGS: CTA CHEST FINDINGS Cardiovascular: UPPER limits normal heart size noted with mild coronary artery calcifications. There is no evidence of thoracic aortic aneurysm or dissection. No large/central pulmonary emboli are present. No pericardial effusion Mediastinum/Nodes: No enlarged mediastinal, hilar, or axillary lymph nodes. Thyroid gland, trachea, and esophagus demonstrate no significant findings. Lungs/Pleura: The lungs are clear. There is no evidence of airspace disease, consolidation, nodule, mass, pleural effusion or pneumothorax. Musculoskeletal: No chest wall abnormality. No acute or significant osseous findings. Review of the MIP images confirms the above findings. CTA ABDOMEN AND PELVIS FINDINGS VASCULAR There is no evidence of abdominal aortic  aneurysm or dissection. Mild atherosclerotic calcifications noted. The visualized mesenteric and renal arteries are patent. No iliac artery aneurysm or dissection identified. Review of the MIP images confirms the above findings. NON-VASCULAR Hepatobiliary: No significant hepatic abnormalities identified. The patient is status post cholecystectomy. No biliary dilatation. Pancreas: Probable mild inflammation adjacent to the pancreatic head/uncinate process noted and suggestive of pancreatitis. No evidence of pancreatic necrosis, hemorrhage, acute fluid collection or venous thrombosis. Spleen: Unremarkable Adrenals/Urinary Tract: The kidneys, adrenal glands and bladder are unremarkable. Stomach/Bowel: Stomach is within normal limits. Appendix appears normal. No evidence of bowel wall thickening, distention, or inflammatory changes. Colonic diverticulosis noted without evidence of diverticulitis. Lymphatic: No enlarged lymph nodes identified. Reproductive:  Prostate enlargement noted. Other: Bilateral inguinal hernias containing fat noted, small to moderate on the LEFT and small on the RIGHT. No ascites, focal collection or pneumoperitoneum. Musculoskeletal: No acute bony abnormality or suspicious bony lesion. Mild degenerative changes in the LOWER lumbar spine identified. Review of the MIP images confirms the above findings. IMPRESSION: 1. No evidence of aortic aneurysm or dissection. 2. Probable mild inflammation adjacent to the pancreatic head/uncinate process likely represent acute pancreatitis. No evidence of pancreatic necrosis, acute fluid collection, hemorrhage or venous thrombosis. Correlate clinically. 3. UPPER limits normal heart size and coronary artery disease. 4. Prostate enlargement. 5. Bilateral inguinal hernias containing fat. 6.  Aortic Atherosclerosis (ICD10-I70.0). Electronically Signed   By: Margarette Canada M.D.   On: 01/10/2018 12:06    Procedures Procedures (including critical care time)  Medications Ordered in ED Medications  pantoprazole (PROTONIX) injection 40 mg (40 mg Intravenous Given 01/10/18 1130)  morphine 4 MG/ML injection 4 mg (4 mg Intravenous Given 01/10/18 1131)  iopamidol (ISOVUE-370) 76 % injection (100 mLs  Contrast Given 01/10/18 1106)  ondansetron (ZOFRAN) injection 4 mg (4 mg Intravenous Given 01/10/18 1246)     Initial Impression / Assessment and Plan / ED Course  I have reviewed the triage vital signs and the nursing notes.  Pertinent labs & imaging results that were available during my care of the patient were reviewed by me and considered in my medical decision making (see chart for details).     13:25 consult Dr. Paulita Fujita. Final Clinical Impressions(s) / ED Diagnoses   Final diagnoses:  Epigastric pain  Acute pancreatitis without infection or necrosis, unspecified pancreatitis type  CT scan shows an M formation at the head of the pancreas.  No necrosis or cyst.  CT ruled out dissection or other emergent  vascular surgical etiology.  Interestingly, patient's lipase is not elevated with CT and clinical findings suggestive of pancreatitis.  Patient's vital signs are stable.  Pain is controlled with medications.  I have reviewed the case with Dr. Paulita Fujita, at this time with patient being stable and tolerating oral pain medications, plan will be for outpatient follow-up with GI.  Patient is agreeable with this plan.  He is aware of return precautions.  ED Discharge Orders        Ordered    pantoprazole (PROTONIX) 20 MG tablet  Daily     01/10/18 1327    oxyCODONE-acetaminophen (PERCOCET) 5-325 MG tablet  Every 6 hours PRN     01/10/18 1327    ondansetron (ZOFRAN ODT) 4 MG disintegrating tablet  Every 4 hours PRN     01/10/18 1327       Charlesetta Shanks, MD 01/11/18 [phone]

## 2018-01-10 NOTE — ED Notes (Addendum)
Pt states started having abd pain on Monday radiating to back between shoulders-- started taking Lipitor on Monday-- was seen here with pericarditis on  March 5th-- had a cath at that time- had 40% blockage-- started on lipitor then -- had stopped for awhile, then started again on  Monday.

## 2018-01-10 NOTE — ED Triage Notes (Signed)
Reports upper abdominal pain that started two days ago.  Hx of pancreatitis.  Reports it feels the same.  Also endorses some n/v.

## 2018-01-24 DIAGNOSIS — K859 Acute pancreatitis without necrosis or infection, unspecified: Secondary | ICD-10-CM | POA: Diagnosis not present

## 2018-01-24 DIAGNOSIS — Z1211 Encounter for screening for malignant neoplasm of colon: Secondary | ICD-10-CM | POA: Diagnosis not present

## 2018-02-19 ENCOUNTER — Other Ambulatory Visit: Payer: Medicare Other

## 2018-03-06 ENCOUNTER — Encounter: Payer: Self-pay | Admitting: Adult Health

## 2018-03-10 DIAGNOSIS — J029 Acute pharyngitis, unspecified: Secondary | ICD-10-CM | POA: Diagnosis not present

## 2018-03-10 DIAGNOSIS — J069 Acute upper respiratory infection, unspecified: Secondary | ICD-10-CM | POA: Diagnosis not present

## 2018-04-09 ENCOUNTER — Encounter: Payer: Self-pay | Admitting: Internal Medicine

## 2018-04-09 ENCOUNTER — Ambulatory Visit (INDEPENDENT_AMBULATORY_CARE_PROVIDER_SITE_OTHER): Payer: Medicare HMO | Admitting: Internal Medicine

## 2018-04-09 VITALS — BP 120/60 | HR 74 | Temp 98.2°F | Ht 69.0 in | Wt 204.0 lb

## 2018-04-09 DIAGNOSIS — J029 Acute pharyngitis, unspecified: Secondary | ICD-10-CM | POA: Diagnosis not present

## 2018-04-09 NOTE — Progress Notes (Signed)
   Subjective:    Patient ID: Jacob Le, male    DOB: Sep 11, 1951, 67 y.o.   MRN: [id number]  HPI 67 year old Male with protracted sore throat. On May 19 had onset URI with sore throat. About a week later had strep test at Urgent Care which was negative. Given Amoxicillin and injection of steroids. Had very painful sore throat however, got better. Then seem to have recurrence of sore throat 2 weeks later.Mucous was not discolored but was described as slimy.  Pt presented to ED January 07, 2018 with chest pain nd inferior lead ST elevation. Had emergent cardiac cath with moderate non-obstructive disease in RCA, circumflex and LAD. Felt to have pleutitic chest pain and was treated with oral meds. He was placed on ASA 81 mg daily  and Lipitor.   Worked in yard all day yesterday. No fatigue yesterday On arrival pulse noted to be a bit slow. Later after exam. Pulse increased to 74 and stable with ambulation. Pt asymtomatic and is to watch this at home. Not on betea blocker.       Review of Systems see above-says sore throat has improved over the past few days     Objective:   Physical Exam Skin warm and dry.  Nodes none.  Pharynx is clear without exudate.  Both TMs are clear with good light reflexes.  Neck is supple without adenopathy.  Chest clear to auscultation without rales or wheezing.       Assessment & Plan:  Protracted sore throat-now improved.  It is not feeling he likely had a protracted viral pharyngitis that has resolved.  He was around lots of family including children.  Bradycardia-improved after initial reading.  Patient will watch this at home  Plan: No treatment given today.  He had previously received treatment with amoxicillin and a steroid injection.  Symptoms were slow to resolve but he now feels normal without sore throat.  He was reassured.

## 2018-04-09 NOTE — Patient Instructions (Signed)
It was a pleasure to see you today.  Follow-up as needed.  Watch pulse at home.

## 2018-04-10 ENCOUNTER — Other Ambulatory Visit: Payer: Medicare HMO | Admitting: *Deleted

## 2018-04-10 DIAGNOSIS — E785 Hyperlipidemia, unspecified: Secondary | ICD-10-CM | POA: Diagnosis not present

## 2018-04-11 LAB — LIPID PANEL
Chol/HDL Ratio: 2.3 ratio (ref 0.0–5.0)
Cholesterol, Total: 138 mg/dL (ref 100–199)
HDL: 59 mg/dL (ref >39)
LDL Calculated: 55 mg/dL (ref 0–99)
TRIGLYCERIDES: 118 mg/dL (ref 0–149)
VLDL Cholesterol Cal: 24 mg/dL (ref 5–40)

## 2018-04-11 LAB — HEPATIC FUNCTION PANEL
ALK PHOS: 77 IU/L (ref 39–117)
ALT: 64 IU/L — ABNORMAL HIGH (ref 0–44)
AST: 44 IU/L — ABNORMAL HIGH (ref 0–40)
Albumin: 4.9 g/dL — ABNORMAL HIGH (ref 3.6–4.8)
BILIRUBIN TOTAL: 1.9 mg/dL — AB (ref 0.0–1.2)
BILIRUBIN, DIRECT: 0.39 mg/dL (ref 0.00–0.40)
TOTAL PROTEIN: 7.4 g/dL (ref 6.0–8.5)

## 2018-04-25 DIAGNOSIS — R69 Illness, unspecified: Secondary | ICD-10-CM | POA: Diagnosis not present

## 2018-05-13 ENCOUNTER — Ambulatory Visit: Payer: Medicare Other | Admitting: Adult Health

## 2018-08-18 ENCOUNTER — Other Ambulatory Visit: Payer: Self-pay | Admitting: Physician Assistant

## 2018-08-21 ENCOUNTER — Ambulatory Visit: Payer: Medicare HMO | Admitting: Cardiovascular Disease

## 2018-08-21 ENCOUNTER — Encounter: Payer: Self-pay | Admitting: Cardiovascular Disease

## 2018-08-21 VITALS — BP 132/70 | HR 40 | Ht 69.0 in | Wt 211.0 lb

## 2018-08-21 DIAGNOSIS — I441 Atrioventricular block, second degree: Secondary | ICD-10-CM

## 2018-08-21 DIAGNOSIS — I319 Disease of pericardium, unspecified: Secondary | ICD-10-CM | POA: Diagnosis not present

## 2018-08-21 DIAGNOSIS — I2510 Atherosclerotic heart disease of native coronary artery without angina pectoris: Secondary | ICD-10-CM | POA: Diagnosis not present

## 2018-08-21 NOTE — Progress Notes (Signed)
Chief Complaint  Patient presents with  . Follow-up    CAD   History of Present Illness: 67 yo male with history of CAD, second degree AV block and sleep apnea here today for cardiac follow up. He was admitted to [facility] March 2019 with chest pain. Cardiac cath with moderate non-obstructive CAD. LV systolic function was normal. He was treated for pericarditis. Echo March 2019 with LVEF=60-65%. There was mild AI, mild to moderate MR. He was noted to be in Mobitz 1 AV block which was not new for him. He was not started on a beta blocker. He was doing well when seen in our office 01/07/18 with no recurrent chest pain.   He is here today for follow up. The patient denies any chest pain, dyspnea, palpitations, lower extremity edema, orthopnea, PND, dizziness, near syncope or syncope.   Primary Care Physician: Elby Showers, MD   Past Medical History:  Diagnosis Date  . Dysrhythmia    2nd degree heart block, Type 1  . Sleep apnea     Past Surgical History:  Procedure Laterality Date  . broken left shoulder    . LEFT HEART CATH AND CORONARY ANGIOGRAPHY N/A 12/18/2017   Procedure: LEFT HEART CATH AND CORONARY ANGIOGRAPHY;  Surgeon: Burnell Blanks, MD;  Location: [facility];  Service: Cardiovascular;  Laterality: N/A;  . pancreatis attack    . pericarditis    . torn right rotator cuff      Current Outpatient Medications  Medication Sig Dispense Refill  . aspirin EC 81 MG EC tablet Take 1 tablet (81 mg total) by mouth daily.    Marland Kitchen atorvastatin (LIPITOR) 40 MG tablet TAKE 1 TABLET DAILY AT 6 PM 90 tablet 0  . ibuprofen (ADVIL,MOTRIN) 800 MG tablet Take 1 tablet (800 mg total) by mouth 3 (three) times daily with meals. 21 tablet 0   No current facility-administered medications for this visit.     Allergies  Allergen Reactions  . Codeine Nausea And Vomiting    Social History   Socioeconomic History  . Marital status: Married    Spouse name: Not on file  . Number of  children: Not on file  . Years of education: Not on file  . Highest education level: Not on file  Occupational History  . Not on file  Social Needs  . Financial resource strain: Not on file  . Food insecurity:    Worry: Not on file    Inability: Not on file  . Transportation needs:    Medical: Not on file    Non-medical: Not on file  Tobacco Use  . Smoking status: Never Smoker  . Smokeless tobacco: Never Used  Substance and Sexual Activity  . Alcohol use: Yes    Comment: 4oz weekly   . Drug use: No  . Sexual activity: Not on file  Lifestyle  . Physical activity:    Days per week: Not on file    Minutes per session: Not on file  . Stress: Not on file  Relationships  . Social connections:    Talks on phone: Not on file    Gets together: Not on file    Attends religious service: Not on file    Active member of club or organization: Not on file    Attends meetings of clubs or organizations: Not on file    Relationship status: Not on file  . Intimate partner violence:    Fear of current or ex partner:  Not on file    Emotionally abused: Not on file    Physically abused: Not on file    Forced sexual activity: Not on file  Other Topics Concern  . Not on file  Social History Narrative  . Not on file    Family History  Problem Relation Age of Onset  . Cancer Mother     Review of Systems:  As stated in the HPI and otherwise negative.   BP 132/70   Pulse (!) 40   Ht 5\' 9"  (1.753 m)   Wt 211 lb (95.7 kg)   SpO2 96%   BMI 31.16 kg/m   Physical Examination: General: Well developed, well nourished, NAD  HEENT: OP clear, mucus membranes moist  SKIN: warm, dry. No rashes. Neuro: No focal deficits  Musculoskeletal: Muscle strength 5/5 all ext  Psychiatric: Mood and affect normal  Neck: No JVD, no carotid bruits, no thyromegaly, no lymphadenopathy.  Lungs:Clear bilaterally, no wheezes, rhonci, crackles Cardiovascular: Regular rate and rhythm. No murmurs, gallops or  rubs. Abdomen:Soft. Bowel sounds present. Non-tender.  Extremities: No lower extremity edema. Pulses are 2 + in the bilateral DP/PT.  Echo 12/18/17 Study Conclusions  - Left ventricle: The cavity size was normal. Wall thickness was normal. Systolic function was normal. The estimated ejection fraction was in the range of 60% to 65%. Wall motion was normal; there were no regional wall motion abnormalities. Features are consistent with a pseudonormal left ventricular filling pattern, with concomitant abnormal relaxation and increased filling pressure (grade 2 diastolic dysfunction). - Aortic valve: There was mild regurgitation. - Mitral valve: There was mild to moderate regurgitation directed centrally. Diastolic regurgitation was present, consistent with AV dyssynchrony. - Left atrium: The atrium was moderately dilated. - Right atrium: The atrium was mildly dilated. - Pulmonary arteries: Systolic pressure was mildly increased. PA peak pressure: 34 mm Hg (S).  Impressions:  - The rhythm throughout the study appears to be sinus with second degree AV block, Mobitz type 1.    LEFT HEART CATH AND CORONARY ANGIOGRAPHY3/5/19  Conclusion     Prox RCA to Mid RCA lesion is 40% stenosed.  Mid RCA lesion is 20% stenosed.  Ost Cx to Prox Cx lesion is 30% stenosed.  Prox Cx to Dist Cx lesion is 20% stenosed.  Prox LAD lesion is 40% stenosed.  Prox LAD to Mid LAD lesion is 40% stenosed.  The left ventricular systolic function is normal.  LV end diastolic pressure is normal.  The left ventricular ejection fraction is 55-65% by visual estimate.  There is no mitral valve regurgitation.  1. 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 Normal LV systolic function  Recommendations: He will be admitted to telemetry. Echo later today. Will start ASA, statin and beta blocker.     EKG:  EKG is ordered today. The ekg ordered today  demonstrates Sinus with second degree AV block,type 1   Recent Labs: 12/18/2017: Magnesium 1.9; TSH 1.950 01/10/2018: BUN 14; Creatinine, Ser 1.17; Hemoglobin 15.0; Platelets 255; Potassium 4.2; Sodium 137 04/10/2018: ALT 64   Lipid Panel    Component Value Date/Time   CHOL 138 04/10/2018 1217   TRIG 118 04/10/2018 1217   HDL 59 04/10/2018 1217   CHOLHDL 2.3 04/10/2018 1217   CHOLHDL 4.0 12/17/2017 2344   VLDL 59 (H) 12/17/2017 2344   LDLCALC 55 04/10/2018 1217     Wt Readings from Last 3 Encounters:  08/21/18 211 lb (95.7 kg)  04/09/18 204 lb (92.5  kg)  01/10/18 210 lb (95.3 kg)     Other studies Reviewed: Additional studies/ records that were reviewed today include: . Review of the above records demonstrates:    Assessment and Plan:   1. CAD without angina: No chest pain. Mild CAD by cath in March 2019. Continue ASA and statin.   2. Pericarditis: Resolved.  3. Second degree AV block, type 1: This has been present for at least the last year. Noted on Holter monitor in February 2018. He is having no dizziness or near syncope. No indication for a pacemaker at this time.   Current medicines are reviewed at length with the patient today.  The patient does not have concerns regarding medicines.  The following changes have been made:  no change  Labs/ tests ordered today include:  No orders of the defined types were placed in this encounter.    Disposition:   FU with me in 12 months   Signed, Lauree Chandler, MD 08/21/2018 4:03 PM    [facility] [address], [place], [place]  [address] Phone: [phone]; Fax: [phone]

## 2018-08-21 NOTE — Patient Instructions (Signed)

## 2018-08-22 NOTE — Addendum Note (Signed)
Addended by: Mendel Ryder on: 08/22/2018 03:10 PM   Modules accepted: Orders

## 2018-09-02 DIAGNOSIS — D1801 Hemangioma of skin and subcutaneous tissue: Secondary | ICD-10-CM | POA: Diagnosis not present

## 2018-09-02 DIAGNOSIS — Z85828 Personal history of other malignant neoplasm of skin: Secondary | ICD-10-CM | POA: Diagnosis not present

## 2018-09-02 DIAGNOSIS — L821 Other seborrheic keratosis: Secondary | ICD-10-CM | POA: Diagnosis not present

## 2018-09-02 DIAGNOSIS — Z87891 Personal history of nicotine dependence: Secondary | ICD-10-CM | POA: Diagnosis not present

## 2018-09-02 DIAGNOSIS — L814 Other melanin hyperpigmentation: Secondary | ICD-10-CM | POA: Diagnosis not present

## 2018-09-02 DIAGNOSIS — I781 Nevus, non-neoplastic: Secondary | ICD-10-CM | POA: Diagnosis not present

## 2018-11-05 DIAGNOSIS — R69 Illness, unspecified: Secondary | ICD-10-CM | POA: Diagnosis not present

## 2018-12-11 ENCOUNTER — Other Ambulatory Visit: Payer: Self-pay | Admitting: Physician Assistant

## 2018-12-11 NOTE — Telephone Encounter (Signed)
Pt's medication was sent to pt's pharmacy as requested. Confirmation received.  °

## 2019-01-27 ENCOUNTER — Telehealth: Payer: Self-pay | Admitting: Cardiovascular Disease

## 2019-01-27 NOTE — Telephone Encounter (Signed)
  Pt c/o medication issue:  1. Name of Medication: atorvastatin (LIPITOR) 40 MG tablet  2. How are you currently taking this medication (dosage and times per day)? TAKE 1 TABLET DAILY AT 6 PM  3. Are you having a reaction (difficulty breathing--STAT)?  Aches and pain in joints and muscles  4. What is your medication issue? Patient is c/o aches and pains, he has taken himself off of it twice for a week or month and both times he quit taking it the pain stopped. Soon as he goes back on it he starts having pain again. He has been off of it this time for 3 weeks and he feels better. He would like to know if he can be put on another medication. He would like to discuss.

## 2019-01-27 NOTE — Telephone Encounter (Signed)
Left detail message. Will forward phone note to Dr Angelena Form for review and recommendations ./cy

## 2019-01-28 MED ORDER — ATORVASTATIN CALCIUM 10 MG PO TABS: 10.0000 mg | ORAL_TABLET | Freq: Every day | ORAL | 3 refills | Status: DC

## 2019-01-28 NOTE — Telephone Encounter (Signed)
Left detailed message of recommendations.  Asked that patient call back and at that time we will send new dose of atorvastatin to pharmacy.

## 2019-01-28 NOTE — Telephone Encounter (Signed)
Follow Up    Pt is returning call, Pt said he did receive the voicemail message and he is calling to confirm that he received the message

## 2019-01-28 NOTE — Telephone Encounter (Signed)
Can we have him try cutting the dose back to 10 mg of Lipitor per day. If this causes aches, I would consider Crestor 5 mg daily. Thanks, chris

## 2019-01-28 NOTE — Telephone Encounter (Signed)
Sent new prescription to [facility] for atorvastatin 10 mg daily. Of note below, if this causes aches Dr. Angelena Form will consider switching to Crestor 5 mg daily.

## 2019-01-30 ENCOUNTER — Other Ambulatory Visit: Payer: Self-pay | Admitting: Cardiovascular Disease

## 2019-02-05 MED ORDER — ATORVASTATIN CALCIUM 10 MG PO TABS: 10.0000 mg | ORAL_TABLET | Freq: Every day | ORAL | 3 refills | Status: DC

## 2019-02-05 NOTE — Telephone Encounter (Signed)
Pt's medication was sent to a different pharmacy as requested. Confirmation received.

## 2019-02-05 NOTE — Addendum Note (Signed)
Addended by: Derl Barrow on: 02/05/2019 11:36 AM   Modules accepted: Orders

## 2019-03-25 ENCOUNTER — Other Ambulatory Visit: Payer: Self-pay

## 2019-03-25 ENCOUNTER — Other Ambulatory Visit: Payer: Medicare HMO | Admitting: Internal Medicine

## 2019-03-25 DIAGNOSIS — Z125 Encounter for screening for malignant neoplasm of prostate: Secondary | ICD-10-CM | POA: Diagnosis not present

## 2019-03-25 DIAGNOSIS — R7302 Impaired glucose tolerance (oral): Secondary | ICD-10-CM

## 2019-03-25 DIAGNOSIS — E781 Pure hyperglyceridemia: Secondary | ICD-10-CM

## 2019-03-25 DIAGNOSIS — I459 Conduction disorder, unspecified: Secondary | ICD-10-CM

## 2019-03-25 DIAGNOSIS — I440 Atrioventricular block, first degree: Secondary | ICD-10-CM | POA: Diagnosis not present

## 2019-03-25 DIAGNOSIS — Z0000 Encounter for general adult medical examination without abnormal findings: Secondary | ICD-10-CM

## 2019-03-26 LAB — COMPLETE METABOLIC PANEL WITH GFR
AG Ratio: 2.3 (calc) (ref 1.0–2.5)
ALT: 21 U/L (ref 9–46)
AST: 19 U/L (ref 10–35)
Albumin: 4.5 g/dL (ref 3.6–5.1)
Alkaline phosphatase (APISO): 63 U/L (ref 35–144)
BUN: 24 mg/dL (ref 7–25)
CO2: 23 mmol/L (ref 20–32)
Calcium: 9.5 mg/dL (ref 8.6–10.3)
Chloride: 109 mmol/L (ref 98–110)
Creat: 1.2 mg/dL (ref 0.70–1.25)
GFR, Est African American: 71 mL/min/{1.73_m2} (ref >=60)
GFR, Est Non African American: 61 mL/min/{1.73_m2} (ref >=60)
Globulin: 2 g/dL (calc) (ref 1.9–3.7)
Glucose, Bld: 103 mg/dL — ABNORMAL HIGH (ref 65–99)
Potassium: 5.3 mmol/L (ref 3.5–5.3)
Sodium: 141 mmol/L (ref 135–146)
Total Bilirubin: 1.1 mg/dL (ref 0.2–1.2)
Total Protein: 6.5 g/dL (ref 6.1–8.1)

## 2019-03-26 LAB — LIPID PANEL
Cholesterol: 219 mg/dL — ABNORMAL HIGH (ref <200)
HDL: 45 mg/dL (ref >=40)
LDL Cholesterol (Calc): 147 mg/dL (calc) — ABNORMAL HIGH
Non-HDL Cholesterol (Calc): 174 mg/dL (calc) — ABNORMAL HIGH (ref <130)
Total CHOL/HDL Ratio: 4.9 (calc) (ref <5.0)
Triglycerides: 148 mg/dL (ref <150)

## 2019-03-26 LAB — CBC WITH DIFFERENTIAL/PLATELET
Absolute Monocytes: 513 cells/uL (ref 200–950)
Basophils Absolute: 38 cells/uL (ref 0–200)
Basophils Relative: 0.7 %
Eosinophils Absolute: 232 cells/uL (ref 15–500)
Eosinophils Relative: 4.3 %
HCT: 41.7 % (ref 38.5–50.0)
Hemoglobin: 14.4 g/dL (ref 13.2–17.1)
Lymphs Abs: 1906 cells/uL (ref 850–3900)
MCH: 31.2 pg (ref 27.0–33.0)
MCHC: 34.5 g/dL (ref 32.0–36.0)
MCV: 90.5 fL (ref 80.0–100.0)
MPV: 10.4 fL (ref 7.5–12.5)
Monocytes Relative: 9.5 %
Neutro Abs: 2711 cells/uL (ref 1500–7800)
Neutrophils Relative %: 50.2 %
Platelets: 261 10*3/uL (ref 140–400)
RBC: 4.61 10*6/uL (ref 4.20–5.80)
RDW: 13.1 % (ref 11.0–15.0)
Total Lymphocyte: 35.3 %
WBC: 5.4 10*3/uL (ref 3.8–10.8)

## 2019-03-26 LAB — HEMOGLOBIN A1C
Hgb A1c MFr Bld: 5.4 % of total Hgb (ref <5.7)
Mean Plasma Glucose: 108 (calc)
eAG (mmol/L): 6 (calc)

## 2019-03-26 LAB — PSA: PSA: 1.6 ng/mL (ref <=4.0)

## 2019-03-27 ENCOUNTER — Ambulatory Visit (INDEPENDENT_AMBULATORY_CARE_PROVIDER_SITE_OTHER): Payer: Medicare HMO | Admitting: Internal Medicine

## 2019-03-27 ENCOUNTER — Other Ambulatory Visit: Payer: Self-pay

## 2019-03-27 ENCOUNTER — Encounter: Payer: Self-pay | Admitting: Internal Medicine

## 2019-03-27 VITALS — BP 98/60 | HR 38 | Ht 69.0 in | Wt 218.0 lb

## 2019-03-27 DIAGNOSIS — Z0000 Encounter for general adult medical examination without abnormal findings: Secondary | ICD-10-CM

## 2019-03-27 DIAGNOSIS — E7800 Pure hypercholesterolemia, unspecified: Secondary | ICD-10-CM

## 2019-03-27 DIAGNOSIS — Z8679 Personal history of other diseases of the circulatory system: Secondary | ICD-10-CM

## 2019-03-27 MED ORDER — ROSUVASTATIN CALCIUM 5 MG PO TABS: 5.0000 mg | ORAL_TABLET | Freq: Every day | ORAL | 3 refills | Status: DC

## 2019-04-12 ENCOUNTER — Encounter: Payer: Self-pay | Admitting: Internal Medicine
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
## 2019-04-12 NOTE — Patient Instructions (Signed)
It was a pleasure to see you today.  Return in 1 year or as needed. 

## 2019-04-14 LAB — POCT URINALYSIS DIPSTICK
Appearance: NEGATIVE
Bilirubin, UA: NEGATIVE
Blood, UA: NEGATIVE
Glucose, UA: NEGATIVE
Ketones, UA: NEGATIVE
Leukocytes, UA: NEGATIVE
Nitrite, UA: NEGATIVE
Odor: NEGATIVE
Protein, UA: NEGATIVE
Spec Grav, UA: 1.01 (ref 1.010–1.025)
Urobilinogen, UA: 0.2 E.U./dL
pH, UA: 6.5 (ref 5.0–8.0)

## 2019-04-28 ENCOUNTER — Other Ambulatory Visit: Payer: Self-pay

## 2019-04-28 ENCOUNTER — Encounter: Payer: Self-pay | Admitting: Internal Medicine

## 2019-04-28 ENCOUNTER — Ambulatory Visit (INDEPENDENT_AMBULATORY_CARE_PROVIDER_SITE_OTHER): Payer: Medicare HMO | Admitting: Internal Medicine

## 2019-04-28 VITALS — BP 140/70 | HR 68 | Temp 98.3°F | Ht 69.0 in | Wt 216.0 lb

## 2019-04-28 DIAGNOSIS — Z23 Encounter for immunization: Secondary | ICD-10-CM

## 2019-04-28 DIAGNOSIS — E7800 Pure hypercholesterolemia, unspecified: Secondary | ICD-10-CM | POA: Diagnosis not present

## 2019-04-28 NOTE — Progress Notes (Signed)
   Subjective:    Patient ID: Jacob Le, male    DOB: 1951-01-02, 68 y.o.   MRN: [id number]  HPI 68 year old Male started on low-dose Crestor on June 11.  Lipid panel on June 9 showed total cholesterol of 219, LDL cholesterol of 147.  He was here in June for health maintenance exam and told me he had quit taking his statin medication due to myalgias.  Advised him to take it with coenzyme Q which he has tolerated this regimen now.  He will follow-up in late July for repeat lipid panel and liver functions.  Also, is here for Pneumovax 23 vaccine.      Review of Systems seeing Dr. Nelva Bush for back pain/spinal stenosis.     Objective:   Physical Exam  Not examined this visit but spent  10 minutes speaking with him about hyperlipidemia.  Pneumococcal 23 vaccine given today.  Follow-up with lab work late July on Crestor which he has restarted.      Assessment & Plan:  Pure hypercholesterolemia now on Crestor 5 mg daily and coenzyme Q  Pneumococcal 23 vaccine given today

## 2019-04-30 DIAGNOSIS — M5136 Other intervertebral disc degeneration, lumbar region: Secondary | ICD-10-CM | POA: Diagnosis not present

## 2019-04-30 DIAGNOSIS — M48061 Spinal stenosis, lumbar region without neurogenic claudication: Secondary | ICD-10-CM | POA: Insufficient documentation

## 2019-05-07 DIAGNOSIS — R69 Illness, unspecified: Secondary | ICD-10-CM | POA: Diagnosis not present

## 2019-05-13 NOTE — Patient Instructions (Signed)
Pneumococcal  23 vaccine given today.  Follow-up with lipid panel liver functions late July.  Continue Crestor 5 mg daily with coenzyme Q.

## 2019-05-15 ENCOUNTER — Other Ambulatory Visit: Payer: Medicare HMO | Admitting: Internal Medicine

## 2019-05-15 DIAGNOSIS — M48061 Spinal stenosis, lumbar region without neurogenic claudication: Secondary | ICD-10-CM | POA: Diagnosis not present

## 2019-05-15 DIAGNOSIS — M5136 Other intervertebral disc degeneration, lumbar region: Secondary | ICD-10-CM | POA: Diagnosis not present

## 2019-05-16 ENCOUNTER — Other Ambulatory Visit: Payer: Medicare HMO | Admitting: Internal Medicine

## 2019-05-16 ENCOUNTER — Other Ambulatory Visit: Payer: Self-pay

## 2019-05-16 VITALS — Temp 98.4°F

## 2019-05-16 DIAGNOSIS — E7800 Pure hypercholesterolemia, unspecified: Secondary | ICD-10-CM | POA: Diagnosis not present

## 2019-05-17 LAB — LIPID PANEL
Cholesterol: 182 mg/dL
HDL: 55 mg/dL
LDL Cholesterol (Calc): 110 mg/dL — ABNORMAL HIGH
Non-HDL Cholesterol (Calc): 127 mg/dL
Total CHOL/HDL Ratio: 3.3 (calc)
Triglycerides: 84 mg/dL

## 2019-05-17 LAB — HEPATIC FUNCTION PANEL
AG Ratio: 2.2 (calc) (ref 1.0–2.5)
ALT: 30 U/L (ref 9–46)
AST: 18 U/L (ref 10–35)
Albumin: 4.9 g/dL (ref 3.6–5.1)
Alkaline phosphatase (APISO): 64 U/L (ref 35–144)
Bilirubin, Direct: 0.2 mg/dL (ref 0.0–0.2)
Globulin: 2.2 g/dL (ref 1.9–3.7)
Indirect Bilirubin: 0.6 mg/dL (ref 0.2–1.2)
Total Bilirubin: 0.8 mg/dL (ref 0.2–1.2)
Total Protein: 7.1 g/dL (ref 6.1–8.1)

## 2019-06-05 DIAGNOSIS — M5136 Other intervertebral disc degeneration, lumbar region: Secondary | ICD-10-CM | POA: Diagnosis not present

## 2019-06-10 ENCOUNTER — Emergency Department (HOSPITAL_COMMUNITY): Payer: Medicare HMO

## 2019-06-10 ENCOUNTER — Encounter (HOSPITAL_COMMUNITY): Payer: Self-pay | Admitting: Emergency Medicine

## 2019-06-10 ENCOUNTER — Telehealth: Payer: Self-pay | Admitting: Internal Medicine

## 2019-06-10 ENCOUNTER — Inpatient Hospital Stay (HOSPITAL_COMMUNITY)
Admission: EM | Admit: 2019-06-10 | Discharge: 2019-06-12 | DRG: 391 | Disposition: A | Discharge status: Home / Self Care | Payer: Medicare HMO | Attending: Internal Medicine | Admitting: Internal Medicine

## 2019-06-10 ENCOUNTER — Other Ambulatory Visit: Payer: Self-pay

## 2019-06-10 DIAGNOSIS — K2970 Gastritis, unspecified, without bleeding: Secondary | ICD-10-CM | POA: Diagnosis not present

## 2019-06-10 DIAGNOSIS — K4020 Bilateral inguinal hernia, without obstruction or gangrene, not specified as recurrent: Secondary | ICD-10-CM | POA: Diagnosis not present

## 2019-06-10 DIAGNOSIS — Z03818 Encounter for observation for suspected exposure to other biological agents ruled out: Secondary | ICD-10-CM | POA: Diagnosis not present

## 2019-06-10 DIAGNOSIS — Z7982 Long term (current) use of aspirin: Secondary | ICD-10-CM

## 2019-06-10 DIAGNOSIS — R7989 Other specified abnormal findings of blood chemistry: Secondary | ICD-10-CM | POA: Diagnosis not present

## 2019-06-10 DIAGNOSIS — K2900 Acute gastritis without bleeding: Secondary | ICD-10-CM

## 2019-06-10 DIAGNOSIS — J302 Other seasonal allergic rhinitis: Secondary | ICD-10-CM | POA: Diagnosis present

## 2019-06-10 DIAGNOSIS — R945 Abnormal results of liver function studies: Secondary | ICD-10-CM | POA: Diagnosis not present

## 2019-06-10 DIAGNOSIS — Z7141 Alcohol abuse counseling and surveillance of alcoholic: Secondary | ICD-10-CM | POA: Diagnosis not present

## 2019-06-10 DIAGNOSIS — K449 Diaphragmatic hernia without obstruction or gangrene: Secondary | ICD-10-CM | POA: Diagnosis not present

## 2019-06-10 DIAGNOSIS — R112 Nausea with vomiting, unspecified: Secondary | ICD-10-CM | POA: Diagnosis not present

## 2019-06-10 DIAGNOSIS — Z9049 Acquired absence of other specified parts of digestive tract: Secondary | ICD-10-CM | POA: Diagnosis not present

## 2019-06-10 DIAGNOSIS — G4733 Obstructive sleep apnea (adult) (pediatric): Secondary | ICD-10-CM | POA: Diagnosis not present

## 2019-06-10 DIAGNOSIS — K5730 Diverticulosis of large intestine without perforation or abscess without bleeding: Secondary | ICD-10-CM | POA: Diagnosis not present

## 2019-06-10 DIAGNOSIS — I10 Essential (primary) hypertension: Secondary | ICD-10-CM | POA: Diagnosis not present

## 2019-06-10 DIAGNOSIS — Z885 Allergy status to narcotic agent status: Secondary | ICD-10-CM

## 2019-06-10 DIAGNOSIS — I252 Old myocardial infarction: Secondary | ICD-10-CM

## 2019-06-10 DIAGNOSIS — Z20828 Contact with and (suspected) exposure to other viral communicable diseases: Secondary | ICD-10-CM | POA: Diagnosis not present

## 2019-06-10 DIAGNOSIS — K760 Fatty (change of) liver, not elsewhere classified: Secondary | ICD-10-CM | POA: Diagnosis not present

## 2019-06-10 DIAGNOSIS — E785 Hyperlipidemia, unspecified: Secondary | ICD-10-CM | POA: Diagnosis not present

## 2019-06-10 DIAGNOSIS — F1010 Alcohol abuse, uncomplicated: Secondary | ICD-10-CM | POA: Diagnosis present

## 2019-06-10 DIAGNOSIS — R079 Chest pain, unspecified: Secondary | ICD-10-CM | POA: Diagnosis not present

## 2019-06-10 DIAGNOSIS — Z79899 Other long term (current) drug therapy: Secondary | ICD-10-CM | POA: Diagnosis not present

## 2019-06-10 DIAGNOSIS — K859 Acute pancreatitis without necrosis or infection, unspecified: Secondary | ICD-10-CM | POA: Diagnosis not present

## 2019-06-10 DIAGNOSIS — M62830 Muscle spasm of back: Secondary | ICD-10-CM | POA: Diagnosis not present

## 2019-06-10 DIAGNOSIS — R1013 Epigastric pain: Secondary | ICD-10-CM | POA: Diagnosis not present

## 2019-06-10 DIAGNOSIS — R109 Unspecified abdominal pain: Secondary | ICD-10-CM | POA: Diagnosis present

## 2019-06-10 DIAGNOSIS — R000 Tachycardia, unspecified: Secondary | ICD-10-CM | POA: Diagnosis not present

## 2019-06-10 LAB — COMPREHENSIVE METABOLIC PANEL
ALT: 35 U/L (ref 0–44)
AST: 25 U/L (ref 15–41)
Albumin: 4.5 g/dL (ref 3.5–5.0)
Alkaline Phosphatase: 68 U/L (ref 38–126)
Anion gap: 10 (ref 5–15)
BUN: 20 mg/dL (ref 8–23)
CO2: 21 mmol/L — ABNORMAL LOW (ref 22–32)
Calcium: 9.3 mg/dL (ref 8.9–10.3)
Chloride: 108 mmol/L (ref 98–111)
Creatinine, Ser: 1.18 mg/dL (ref 0.61–1.24)
GFR calc Af Amer: >60 mL/min (ref >60)
GFR calc non Af Amer: >60 mL/min (ref >60)
Glucose, Bld: 139 mg/dL — ABNORMAL HIGH (ref 70–99)
Potassium: 4.2 mmol/L (ref 3.5–5.1)
Sodium: 139 mmol/L (ref 135–145)
Total Bilirubin: 1.2 mg/dL (ref 0.3–1.2)
Total Protein: 7.3 g/dL (ref 6.5–8.1)

## 2019-06-10 LAB — URINALYSIS, ROUTINE W REFLEX MICROSCOPIC
Bacteria, UA: NONE SEEN
Bilirubin Urine: NEGATIVE
Glucose, UA: NEGATIVE mg/dL
Ketones, ur: 20 mg/dL — AB
Leukocytes,Ua: NEGATIVE
Nitrite: NEGATIVE
Protein, ur: NEGATIVE mg/dL
Specific Gravity, Urine: 1.018 (ref 1.005–1.030)
pH: 5 (ref 5.0–8.0)

## 2019-06-10 LAB — CBC
HCT: 44.6 % (ref 39.0–52.0)
Hemoglobin: 15.6 g/dL (ref 13.0–17.0)
MCH: 31.1 pg (ref 26.0–34.0)
MCHC: 35 g/dL (ref 30.0–36.0)
MCV: 88.8 fL (ref 80.0–100.0)
Platelets: 279 10*3/uL (ref 150–400)
RBC: 5.02 MIL/uL (ref 4.22–5.81)
RDW: 12.1 % (ref 11.5–15.5)
WBC: 9.1 10*3/uL (ref 4.0–10.5)
nRBC: 0 % (ref 0.0–0.2)

## 2019-06-10 LAB — TROPONIN I (HIGH SENSITIVITY)
Troponin I (High Sensitivity): 9 ng/L (ref <18)
Troponin I (High Sensitivity): 10 ng/L (ref <18)

## 2019-06-10 LAB — LIPASE, BLOOD: Lipase: 68 U/L — ABNORMAL HIGH (ref 11–51)

## 2019-06-10 MED ORDER — FAMOTIDINE IN NACL 20-0.9 MG/50ML-% IV SOLN
20.0000 mg | Freq: Once | INTRAVENOUS | Status: AC
  Administered 2019-06-10: 20 mg via INTRAVENOUS
  Filled 2019-06-10: qty 50

## 2019-06-10 MED ORDER — SODIUM CHLORIDE 0.9 % IV BOLUS
500.0000 mL | Freq: Once | INTRAVENOUS | Status: AC
  Administered 2019-06-10: 500 mL via INTRAVENOUS

## 2019-06-10 MED ORDER — PANTOPRAZOLE SODIUM 40 MG IV SOLR
40.0000 mg | Freq: Once | INTRAVENOUS | Status: AC
  Administered 2019-06-10: 40 mg via INTRAVENOUS
  Filled 2019-06-10: qty 40

## 2019-06-10 MED ORDER — ONDANSETRON 4 MG PO TBDP
4.0000 mg | ORAL_TABLET | Freq: Once | ORAL | Status: AC
  Administered 2019-06-10: 4 mg via ORAL
  Filled 2019-06-10: qty 1

## 2019-06-10 MED ORDER — SODIUM CHLORIDE 0.9% FLUSH
3.0000 mL | Freq: Once | INTRAVENOUS | Status: AC
  Administered 2019-06-10: 3 mL via INTRAVENOUS

## 2019-06-10 MED ORDER — ALUM & MAG HYDROXIDE-SIMETH 200-200-20 MG/5ML PO SUSP
30.0000 mL | Freq: Once | ORAL | Status: AC
  Administered 2019-06-10: 30 mL via ORAL
  Filled 2019-06-10: qty 30

## 2019-06-10 MED ORDER — OXYCODONE-ACETAMINOPHEN 5-325 MG PO TABS: 1.0000 | ORAL_TABLET | Freq: Four times a day (QID) | ORAL | 0 refills | Status: DC | PRN

## 2019-06-10 MED ORDER — MORPHINE SULFATE (PF) 4 MG/ML IV SOLN
4.0000 mg | Freq: Once | INTRAVENOUS | Status: DC
  Filled 2019-06-10: qty 1

## 2019-06-10 MED ORDER — HYDROCODONE-ACETAMINOPHEN 5-325 MG PO TABS: 1.0000 | ORAL_TABLET | Freq: Four times a day (QID) | ORAL | 0 refills | Status: DC | PRN

## 2019-06-10 MED ORDER — LIDOCAINE 5 % EX PTCH: 1.0000 | MEDICATED_PATCH | CUTANEOUS | 0 refills | Status: DC

## 2019-06-10 MED ORDER — MORPHINE SULFATE (PF) 4 MG/ML IV SOLN
4.0000 mg | Freq: Once | INTRAVENOUS | Status: AC
  Administered 2019-06-10: 4 mg via INTRAVENOUS
  Filled 2019-06-10: qty 1

## 2019-06-10 MED ORDER — ONDANSETRON 4 MG PO TBDP: 4.0000 mg | ORAL_TABLET | Freq: Three times a day (TID) | ORAL | 0 refills | Status: DC | PRN

## 2019-06-10 MED ORDER — SUCRALFATE 1 G PO TABS: 1.0000 g | ORAL_TABLET | Freq: Three times a day (TID) | ORAL | 0 refills | Status: DC

## 2019-06-10 MED ORDER — IOHEXOL 350 MG/ML SOLN
100.0000 mL | Freq: Once | INTRAVENOUS | Status: AC | PRN
  Administered 2019-06-10: 100 mL via INTRAVENOUS

## 2019-06-10 MED ORDER — MORPHINE SULFATE (PF) 4 MG/ML IV SOLN
4.0000 mg | Freq: Once | INTRAVENOUS | Status: AC
  Administered 2019-06-10: 4 mg via INTRAVENOUS

## 2019-06-10 MED ORDER — SUCRALFATE 1 G PO TABS
1.0000 g | ORAL_TABLET | Freq: Once | ORAL | Status: AC
  Administered 2019-06-10: 1 g via ORAL
  Filled 2019-06-10: qty 1

## 2019-06-10 MED ORDER — ESOMEPRAZOLE MAGNESIUM 40 MG PO CPDR: 40.0000 mg | DELAYED_RELEASE_CAPSULE | Freq: Every day | ORAL | 0 refills | Status: DC

## 2019-06-10 MED ORDER — HYDROCODONE-ACETAMINOPHEN 5-325 MG PO TABS
2.0000 | ORAL_TABLET | Freq: Once | ORAL | Status: AC
  Administered 2019-06-10: 2 via ORAL
  Filled 2019-06-10: qty 2

## 2019-06-10 NOTE — Telephone Encounter (Signed)
After speaking with Dr Renold Genta, she advised that Tc go straight to [facility] ED, that it is hard to tell if Heart Attack or Pancreatis Attack, its better to go get checked out. Called and spoke with Benjamine Mola and she verbalized understanding and was going to take him immediately to [facility].

## 2019-06-10 NOTE — ED Provider Notes (Signed)
9:40 PM  Patient given in sign out at shift change from [facility]. Patient is here for epigastric abdominal pain, nausea and vomiting onset 6 AM this morning and feeling similar to previous episodes of pancreatitis.  He attributes this current abdominal pain to increased alcohol intake during his quarantine.  Patient labs show slightly elevated lipase, no elevated white blood cell count CMP shows slightly low bicarb with elevated glucose likely secondary to vomiting and acute phase reaction.  Patient's UA shows a small amount of hemoglobin and 20 ketones.  Opponent negative.  CT chest abdomen pelvis negative for acute abnormality and also specifically negative for evidence of pancreatitis.  Suppose that this is acute gastritis flare.  Patient has required multiple doses of antiemetic and pain medications.  Original plan was for the patient to get another dose of morphine, Carafate and Pepcid and to be discharged home however patient continues to vomit and is unable to hold down fluids or pain meds.  At this point will have the patient admitted for intractable vomiting and epigastric abdominal pain.   Margarita Mail, PA-C 06/13/19 1527    Charlesetta Shanks, MD 06/21/19 [phone]

## 2019-06-10 NOTE — ED Provider Notes (Addendum)
[facility] EMERGENCY DEPARTMENT Provider Note   CSN: [id number] Arrival date & time: 06/10/19  1252     History   Chief Complaint Chief Complaint  Patient presents with   Abdominal Pain    HPI Jacob Le is a 68 y.o. male with history of STEMI, pericarditis, pancreatitis, second-degree heart block presents today for evaluation of epigastric pain.  Patient reports that at 6 AM this morning he had a gradual onset of burning epigastric pain consistent with prior pancreatitis bouts.  He reports that pain has increased throughout the day and is now a severe in intensity constant pain worsened with palpation and without alleviating factors.  He has attempted ibuprofen without relief of his pain.  He attributes this current pancreatitis flare to his increased alcohol intake during quarantine.  Additionally patient endorsing mid upper back pain between the scapula reports his pain has been present for 3 days, he feels he may have slept wrong he has been treating this with ibuprofen with relief.  He reports some increase in the mid upper back pain today but does not feel this is related to his epigastric pain, he feels this is a separate pain today.  Patient denies fever/chills, headache/vision changes, neck pain, numbness/weakness, tingling, chest pain/shortness of breath, cough, extremity pain/swelling, nausea/vomiting, diarrhea, dysuria/hematuria or any additional concerns.    HPI  Past Medical History:  Diagnosis Date   Dysrhythmia    2nd degree heart block, Type 1   Sleep apnea     Patient Active Problem List   Diagnosis Date Noted   STEMI (ST elevation myocardial infarction) ([place]) 12/18/2017   Chest pain 12/18/2017   Acute pericarditis 12/18/2017   Second degree AV block, Mobitz type I 12/18/2017   Allergic rhinitis 11/12/2017   OSA on CPAP 01/04/2017   First degree AV block 12/12/2016    Past Surgical History:  Procedure Laterality Date    broken left shoulder     LEFT HEART CATH AND CORONARY ANGIOGRAPHY N/A 12/18/2017   Procedure: LEFT HEART CATH AND CORONARY ANGIOGRAPHY;  Surgeon: Burnell Blanks, MD;  Location: [facility];  Service: Cardiovascular;  Laterality: N/A;   pancreatis attack     pericarditis     torn right rotator cuff          Home Medications    Prior to Admission medications   Medication Sig Start Date End Date Taking? Authorizing Provider  aspirin EC 81 MG EC tablet Take 1 tablet (81 mg total) by mouth daily. 12/19/17   Bhagat, Crista Luria, PA  HYDROcodone-acetaminophen (NORCO/VICODIN) 5-325 MG tablet Take 1-2 tablets by mouth every 6 (six) hours as needed. 06/10/19   Nuala Alpha A, PA-C  ibuprofen (ADVIL,MOTRIN) 800 MG tablet Take 1 tablet (800 mg total) by mouth 3 (three) times daily with meals. 12/18/17   Bhagat, Bhavinkumar, PA  lidocaine (LIDODERM) 5 % Place 1 patch onto the skin daily. Remove & Discard patch within 12 hours or as directed by MD 06/10/19   Nuala Alpha A, PA-C  ondansetron (ZOFRAN ODT) 4 MG disintegrating tablet Take 1 tablet (4 mg total) by mouth every 8 (eight) hours as needed for nausea or vomiting. 06/10/19   Nuala Alpha A, PA-C  rosuvastatin (CRESTOR) 5 MG tablet Take 1 tablet (5 mg total) by mouth daily. 03/27/19   Elby Showers, MD    Family History Family History  Problem Relation Age of Onset   Cancer Mother     Social History Social History  Tobacco Use   Smoking status: Never Smoker   Smokeless tobacco: Never Used  Substance Use Topics   Alcohol use: Yes    Comment: 4oz weekly    Drug use: No     Allergies   Codeine   Review of Systems Review of Systems Ten systems are reviewed and are negative for acute change except as noted in the HPI  Physical Exam Updated Vital Signs BP (!) 125/99    Pulse 99    Temp 98.5 F (36.9 C) (Oral)    Resp 15    SpO2 98%   Physical Exam Constitutional:      General: He is not in acute  distress.    Appearance: Normal appearance. He is well-developed. He is not ill-appearing or diaphoretic.  HENT:     Head: Normocephalic and atraumatic.     Right Ear: External ear normal.     Left Ear: External ear normal.     Nose: Nose normal.  Eyes:     General: Vision grossly intact. Gaze aligned appropriately.     Pupils: Pupils are equal, round, and reactive to light.  Neck:     Musculoskeletal: Full passive range of motion without pain, normal range of motion and neck supple.     Trachea: Trachea and phonation normal. No tracheal tenderness or tracheal deviation.  Cardiovascular:     Rate and Rhythm: Regular rhythm. Tachycardia present.     Pulses:          Radial pulses are 2+ on the right side and 2+ on the left side.       Dorsalis pedis pulses are 2+ on the right side and 2+ on the left side.     Heart sounds: Normal heart sounds.  Pulmonary:     Effort: Pulmonary effort is normal. No accessory muscle usage or respiratory distress.     Breath sounds: Normal breath sounds and air entry.  Abdominal:     General: Bowel sounds are normal. There is no distension.     Palpations: Abdomen is soft.     Tenderness: There is abdominal tenderness in the epigastric area. There is no guarding or rebound.  Musculoskeletal: Normal range of motion.     Right lower leg: Normal. He exhibits no tenderness. No edema.     Left lower leg: Normal. He exhibits no tenderness. No edema.  Feet:     Right foot:     Protective Sensation: 3 sites tested. 3 sites sensed.     Left foot:     Protective Sensation: 3 sites tested. 3 sites sensed.  Skin:    General: Skin is warm and dry.  Neurological:     Mental Status: He is alert.     GCS: GCS eye subscore is 4. GCS verbal subscore is 5. GCS motor subscore is 6.     Comments: Speech is clear and goal oriented, follows commands Major Cranial nerves without deficit, no facial droop Normal strength in upper and lower extremities bilaterally  including dorsiflexion and plantar flexion, strong and equal grip strength Sensation normal to light and sharp touch Moves extremities without ataxia, coordination intact  Psychiatric:        Behavior: Behavior normal.    ED Treatments / Results  Labs (all labs ordered are listed, but only abnormal results are displayed) Labs Reviewed  LIPASE, BLOOD - Abnormal; Notable for the following components:      Result Value   Lipase 68 (*)    All  other components within normal limits  COMPREHENSIVE METABOLIC PANEL - Abnormal; Notable for the following components:   CO2 21 (*)    Glucose, Bld 139 (*)    All other components within normal limits  URINALYSIS, ROUTINE W REFLEX MICROSCOPIC - Abnormal; Notable for the following components:   Hgb urine dipstick SMALL (*)    Ketones, ur 20 (*)    All other components within normal limits  CBC  TROPONIN I (HIGH SENSITIVITY)  TROPONIN I (HIGH SENSITIVITY)    EKG EKG Interpretation  Date/Time:  Tuesday June 10 2019 12:59:52 EDT Ventricular Rate:  94 PR Interval:  376 QRS Duration: 70 QT Interval:  350 QTC Calculation: 437 R Axis:   54 Text Interpretation:  Sinus rhythm with marked sinus arrhythmia with 1st degree A-V block Otherwise normal ECG since last tracing no significant change Confirmed by Daleen Bo [phone]) on 06/10/2019 6:11:13 PM   Radiology Dg Chest Portable 1 View  Result Date: 06/10/2019 CLINICAL DATA:  Epigastric abdominal pain. EXAM: PORTABLE CHEST 1 VIEW COMPARISON:  Radiographs of December 17, 2017. FINDINGS: The heart size and mediastinal contours are within normal limits. Both lungs are clear. No pneumothorax or pleural effusion is noted. The visualized skeletal structures are unremarkable. IMPRESSION: No active disease. Electronically Signed   By: Marijo Conception M.D.   On: 06/10/2019 19:38   Ct Angio Chest/abd/pel For Dissection W And/or Wo Contrast  Result Date: 06/10/2019 CLINICAL DATA:  Chest and back pain EXAM:  CT ANGIOGRAPHY CHEST, ABDOMEN AND PELVIS TECHNIQUE: Multidetector CT imaging through the chest, abdomen and pelvis was performed using the standard protocol during bolus administration of intravenous contrast. Multiplanar reconstructed images and MIPs were obtained and reviewed to evaluate the vascular anatomy. CONTRAST:  139mL OMNIPAQUE IOHEXOL 350 MG/ML SOLN COMPARISON:  CTA chest, abdomen and pelvis 01/10/2018 FINDINGS: CTA CHEST FINDINGS Cardiovascular: No filling defects in the pulmonary arteries to suggest pulmonary emboli. Heart is normal size. Aorta is normal caliber with scattered calcifications. No dissection. Scattered coronary artery calcifications in the left anterior descending coronary artery. Mediastinum/Nodes: No mediastinal, hilar, or axillary adenopathy. Small hiatal hernia. Trachea and esophagus are unremarkable. Lungs/Pleura: Lungs are clear. No focal airspace opacities or suspicious nodules. No effusions. Musculoskeletal: Chest wall soft tissues are unremarkable. No acute bony abnormality. Review of the MIP images confirms the above findings. CTA ABDOMEN AND PELVIS FINDINGS VASCULAR Aorta: Scattered aortic calcifications.  No aneurysm or dissection. Celiac: Patent without evidence of aneurysm, dissection, vasculitis or significant stenosis. SMA: Patent without evidence of aneurysm, dissection, vasculitis or significant stenosis. Renals: Both renal arteries are patent without evidence of aneurysm, dissection, vasculitis, fibromuscular dysplasia or significant stenosis. IMA: Patent without evidence of aneurysm, dissection, vasculitis or significant stenosis. Inflow: Patent without evidence of aneurysm, dissection, vasculitis or significant stenosis. Veins: No obvious venous abnormality within the limitations of this arterial phase study. Review of the MIP images confirms the above findings. NON-VASCULAR Hepatobiliary: No focal liver abnormality is seen. Status post cholecystectomy. No biliary  dilatation. Pancreas: No focal abnormality or ductal dilatation. Spleen: No focal abnormality.  Normal size. Adrenals/Urinary Tract: No adrenal abnormality. No focal renal abnormality. No stones or hydronephrosis. Urinary bladder is unremarkable. Stomach/Bowel: Sigmoid diverticulosis. No active diverticulitis. Stomach and small bowel decompressed, unremarkable. Lymphatic: No adenopathy. Reproductive: Prostate enlargement with central calcifications. Other: No free fluid or free air. Bilateral inguinal hernias containing fat. Musculoskeletal: No acute bony abnormality. Review of the MIP images confirms the above findings. IMPRESSION: No evidence of aortic aneurysm or  dissection. No evidence of pulmonary embolus. Scattered aortic calcifications. Coronary artery disease. Small hiatal hernia. Sigmoid diverticulosis.  No active diverticulitis. Small bilateral inguinal hernias containing fat. Prostate enlargement. No acute findings. Electronically Signed   By: Rolm Baptise M.D.   On: 06/10/2019 20:01    Procedures Procedures (including critical care time)  Medications Ordered in ED Medications  sodium chloride flush (NS) 0.9 % injection 3 mL (has no administration in time range)  sodium chloride 0.9 % bolus 500 mL (has no administration in time range)  morphine 4 MG/ML injection 4 mg (4 mg Intravenous Given 06/10/19 1900)  iohexol (OMNIPAQUE) 350 MG/ML injection 100 mL (100 mLs Intravenous Contrast Given 06/10/19 1941)  alum & mag hydroxide-simeth (MAALOX/MYLANTA) 200-200-20 MG/5ML suspension 30 mL (30 mLs Oral Given 06/10/19 2030)  HYDROcodone-acetaminophen (NORCO/VICODIN) 5-325 MG per tablet 2 tablet (2 tablets Oral Given 06/10/19 2100)  ondansetron (ZOFRAN-ODT) disintegrating tablet 4 mg (4 mg Oral Given 06/10/19 2100)     Initial Impression / Assessment and Plan / ED Course  I have reviewed the triage vital signs and the nursing notes.  Pertinent labs & imaging results that were available during my  care of the patient were reviewed by me and considered in my medical decision making (see chart for details).    CBC within normal limits Lipase 68, mildly elevated possible pancreatitis CMP nonacute Urinalysis nonacute, 20 ketones present HS Troponin: 9 EKG: Sinus rhythm with marked sinus arrhythmia with 1st degree A-V block Otherwise normal ECG since last tracing no significant change Confirmed by Daleen Bo [phone]) on 06/10/2019 6:11:13 PM  As patient with new hypertension, mildly tachycardic with epigastric pain as well as mid upper back pain there is concern for possible dissection.  On chart review patient was seen in March of last year for similar symptoms at that time had a negative dissection study performed.  However as patient with hypertension, no diagnosis of hypertension and no antihypertensive medications feel that CT dissection study is warranted at this time for evaluation, this will also help [place] evaluate patient's likely pancreatitis today. - CXR pending CT dissection study pending - CXR:    IMPRESSION:  No active disease.   CT Dissection study:  IMPRESSION:  No evidence of aortic aneurysm or dissection.    No evidence of pulmonary embolus.    Scattered aortic calcifications.    Coronary artery disease.    Small hiatal hernia.    Sigmoid diverticulosis. No active diverticulitis.    Small bilateral inguinal hernias containing fat.    Prostate enlargement.    No acute findings.     HS-Delta troponin: 10, as patient without chest pain and his epigastric pain started 6 AM with reassuring troponin and EKG additionally with history and above examination do not suspect ACS or other acute cardiopulmonary etiologies of patient's pain today, suspect acute alcohol induced pancreatitis. - Following pain control patient's hypertension improved.  Suspect patient with acute pancreatitis possibly induced by increased alcohol intake recently.  He reports he is  feeling improved here in the ER and is requesting discharge.  He is to follow-up with PCP and gastroenterology regarding his pancreatitis.  Given handout regarding pancreatitis home care.  Patient will be given short course of percocet for his acute pancreatitis.  PMP reviewed patient without active opioid prescription, he was given 1 diazepam it appears last week, no other active narcotic prescriptions.  Additionally Zofran prescribed for nausea and vomiting.  Patient counseled on NSAID use and to avoid overuse as  this may lead to a gastritis, he states understanding.  Patient informed of incidental findings on CT scan and follow-up with PCP regarding this.  As to patient's musculoskeletal back pain, he is already has neurosurgery referral for this next week.  He has no new injury or trauma, no cauda equina-like symptoms and has a normal neurologic examination.  No indication for further imaging or treatment at this time.  At this time there does not appear to be any evidence of an acute emergency medical condition and the patient appears stable for discharge with appropriate outpatient follow up. Diagnosis was discussed with patient who verbalizes understanding of care plan and is agreeable to discharge. I have discussed return precautions with patient and wife who verbalizes understanding of return precautions. Patient encouraged to follow-up with their PCP, GI and spine. All questions answered.  Patient seen and evaluated by Dr. Eulis Foster during this visit who agrees with discharge and outpatient follow-up. ========================= Addendum: At discharge patient endorsed return of his pain.  The initial IV pain medication had worn off and he had been started on p.o. Norco, it appears that this has not began to control patient's pain.  I have ordered additional IV pain medicine as well as Zofran for control of patient's epigastric abdominal pain and N/V.  Signout given to Margarita Mail, PA-C at shift change,  plan of care at this time is to continue pain/nausea control, reassess, potential discharge if controlled.    Note: Portions of this report may have been transcribed using voice recognition software. Every effort was made to ensure accuracy; however, inadvertent computerized transcription errors may still be present. Final Clinical Impressions(s) / ED Diagnoses   Final diagnoses:  Acute pancreatitis without infection or necrosis, unspecified pancreatitis type  Muscle spasm of back    ED Discharge Orders         Ordered    HYDROcodone-acetaminophen (NORCO/VICODIN) 5-325 MG tablet  Every 6 hours PRN     06/10/19 2109    ondansetron (ZOFRAN ODT) 4 MG disintegrating tablet  Every 8 hours PRN     06/10/19 2109    lidocaine (LIDODERM) 5 %  Every 24 hours     06/10/19 2109           Deliah Boston, PA-C 06/10/19 2111    Deliah Boston, PA-C 06/11/19 1304    Daleen Bo, MD 06/12/19 1723

## 2019-06-10 NOTE — Discharge Instructions (Addendum)
Gastritis, Adult ° °Gastritis is swelling (inflammation) of the stomach. Gastritis can develop quickly (acute). It can also develop slowly over time (chronic). It is important to get help for this condition. If you do not get help, your stomach can bleed, and you can get sores (ulcers) in your stomach. °What are the causes? °This condition may be caused by: °· Germs that get to your stomach. °· Drinking too much alcohol. °· Medicines you are taking. °· Too much acid in the stomach. °· A disease of the intestines or stomach. °· Stress. °· An allergic reaction. °· Crohn's disease. °· Some cancer treatments (radiation). °Sometimes the cause of this condition is not known. °What are the signs or symptoms? °Symptoms of this condition include: °· Pain in your stomach. °· A burning feeling in your stomach. °· Feeling sick to your stomach (nauseous). °· Throwing up (vomiting). °· Feeling too full after you eat. °· Weight loss. °· Bad breath. °· Throwing up blood. °· Blood in your poop (stool). °How is this diagnosed? °This condition may be diagnosed with: °· Your medical history and symptoms. °· A physical exam. °· Tests. These can include: °? Blood tests. °? Stool tests. °? A procedure to look inside your stomach (upper endoscopy). °? A test in which a sample of tissue is taken for testing (biopsy). °How is this treated? °Treatment for this condition depends on what caused it. You may be given: °· Antibiotic medicine, if your condition was caused by germs. °· H2 blockers and similar medicines, if your condition was caused by too much acid. °Follow these instructions at home: °Medicines °· Take over-the-counter and prescription medicines only as told by your doctor. °· If you were prescribed an antibiotic medicine, take it as told by your doctor. Do not stop taking it even if you start to feel better. °Eating and drinking ° °· Eat small meals often, instead of large meals. °· Avoid foods and drinks that make your symptoms  worse. °· Drink enough fluid to keep your pee (urine) pale yellow. °Alcohol use °· Do not drink alcohol if: °? Your doctor tells you not to drink. °? You are pregnant, may be pregnant, or are planning to become pregnant. °· If you drink alcohol: °? Limit your use to: °§ 0-1 drink a day for women. °§ 0-2 drinks a day for men. °? Be aware of how much alcohol is in your drink. In the [place] one drink equals one 12 oz bottle of beer (355 mL), one 5 oz glass of wine (148 mL), or one 1½ oz glass of hard liquor (44 mL). °General instructions °· Talk with your doctor about ways to manage stress. You can exercise or do deep breathing, meditation, or yoga. °· Do not smoke or use products that have nicotine or tobacco. If you need help quitting, ask your doctor. °· Keep all follow-up visits as told by your doctor. This is important. °Contact a doctor if: °· Your symptoms get worse. °· Your symptoms go away and then come back. °Get help right away if: °· You throw up blood or something that looks like coffee grounds. °· You have black or dark red poop. °· You throw up any time you try to drink fluids. °· Your stomach pain gets worse. °· You have a fever. °· You do not feel better after one week. °Summary °· Gastritis is swelling (inflammation) of the stomach. °· You must get help for this condition. If you do not get help, your stomach   can bleed, and you can get sores (ulcers). °· This condition is diagnosed with medical history, physical exam, or tests. °· You can be treated with medicines for germs or medicines to block too much acid in your stomach. °This information is not intended to replace advice given to you by your health care provider. Make sure you discuss any questions you have with your health care provider. °Document Released: 03/20/2008 Document Revised: 02/19/2018 Document Reviewed: 02/19/2018 °Elsevier Patient Education © 2020 [employer] ° °

## 2019-06-10 NOTE — ED Provider Notes (Signed)
  Face-to-face evaluation   History:  He presents for evaluation of upper abdominal pain radiating to his back and shoulder blades.  Similar pain previously when examined and evaluated for dissection.  He admits to drinking more alcohol than usual because of the pandemic.  He states he was recently diagnosed with spinal stenosis and pinched nerves in his lumbar region by MRI.  He denies radiation of pain to his legs.  Physical exam: Alert, calm and cooperative.  Abdomen soft and tender epigastric area.  His legs normally without pain or disability.  Dysarthria or aphasia.    Medical screening examination/treatment/procedure(s) were conducted as a shared visit with non-physician practitioner(s) and myself.  I personally evaluated the patient during the encounter    Daleen Bo, MD 06/12/19 1725

## 2019-06-10 NOTE — ED Triage Notes (Addendum)
Pt states he is having some epigastric pain and radiates into mid back in between shoulder blades. Pt has had a pancreatis flare before and felt somewhat similar. Pt states this pain started suddenly today. Pt states there was an unknown cause for his pancreatitis and has follow up with GI.

## 2019-06-10 NOTE — Telephone Encounter (Signed)
Jacob Le [phone]  Jacob Le called to say that Jacob Le is having a lot of pain in the middle of the breast bone this morning with some nausea. He had same type of attach a year ago March.

## 2019-06-10 NOTE — ED Notes (Signed)
Patient verbalizes understanding of discharge instructions. Opportunity for questioning and answers were provided. Armband removed by staff, pt discharged from ED ambulatory.   

## 2019-06-11 ENCOUNTER — Encounter (HOSPITAL_COMMUNITY): Payer: Self-pay | Admitting: Internal Medicine

## 2019-06-11 ENCOUNTER — Ambulatory Visit (HOSPITAL_COMMUNITY): Payer: Medicare HMO

## 2019-06-11 DIAGNOSIS — K2900 Acute gastritis without bleeding: Secondary | ICD-10-CM | POA: Diagnosis not present

## 2019-06-11 DIAGNOSIS — Z9049 Acquired absence of other specified parts of digestive tract: Secondary | ICD-10-CM | POA: Diagnosis not present

## 2019-06-11 DIAGNOSIS — K859 Acute pancreatitis without necrosis or infection, unspecified: Secondary | ICD-10-CM | POA: Diagnosis present

## 2019-06-11 DIAGNOSIS — Z7982 Long term (current) use of aspirin: Secondary | ICD-10-CM | POA: Diagnosis not present

## 2019-06-11 DIAGNOSIS — R112 Nausea with vomiting, unspecified: Secondary | ICD-10-CM | POA: Diagnosis present

## 2019-06-11 DIAGNOSIS — J302 Other seasonal allergic rhinitis: Secondary | ICD-10-CM | POA: Diagnosis present

## 2019-06-11 DIAGNOSIS — I252 Old myocardial infarction: Secondary | ICD-10-CM | POA: Diagnosis not present

## 2019-06-11 DIAGNOSIS — R7989 Other specified abnormal findings of blood chemistry: Secondary | ICD-10-CM | POA: Diagnosis not present

## 2019-06-11 DIAGNOSIS — I10 Essential (primary) hypertension: Secondary | ICD-10-CM | POA: Diagnosis present

## 2019-06-11 DIAGNOSIS — R1013 Epigastric pain: Secondary | ICD-10-CM | POA: Diagnosis not present

## 2019-06-11 DIAGNOSIS — E785 Hyperlipidemia, unspecified: Secondary | ICD-10-CM | POA: Diagnosis present

## 2019-06-11 DIAGNOSIS — R945 Abnormal results of liver function studies: Secondary | ICD-10-CM | POA: Diagnosis not present

## 2019-06-11 DIAGNOSIS — R109 Unspecified abdominal pain: Secondary | ICD-10-CM | POA: Diagnosis present

## 2019-06-11 DIAGNOSIS — M62830 Muscle spasm of back: Secondary | ICD-10-CM | POA: Diagnosis present

## 2019-06-11 DIAGNOSIS — F1010 Alcohol abuse, uncomplicated: Secondary | ICD-10-CM | POA: Diagnosis present

## 2019-06-11 DIAGNOSIS — Z79899 Other long term (current) drug therapy: Secondary | ICD-10-CM | POA: Diagnosis not present

## 2019-06-11 DIAGNOSIS — G4733 Obstructive sleep apnea (adult) (pediatric): Secondary | ICD-10-CM | POA: Diagnosis present

## 2019-06-11 DIAGNOSIS — Z7141 Alcohol abuse counseling and surveillance of alcoholic: Secondary | ICD-10-CM | POA: Diagnosis not present

## 2019-06-11 DIAGNOSIS — Z20828 Contact with and (suspected) exposure to other viral communicable diseases: Secondary | ICD-10-CM | POA: Diagnosis present

## 2019-06-11 DIAGNOSIS — Z885 Allergy status to narcotic agent status: Secondary | ICD-10-CM | POA: Diagnosis not present

## 2019-06-11 DIAGNOSIS — K760 Fatty (change of) liver, not elsewhere classified: Secondary | ICD-10-CM | POA: Diagnosis present

## 2019-06-11 DIAGNOSIS — K2970 Gastritis, unspecified, without bleeding: Secondary | ICD-10-CM | POA: Diagnosis present

## 2019-06-11 LAB — CBC WITH DIFFERENTIAL/PLATELET
Abs Immature Granulocytes: 0.03 10*3/uL (ref 0.00–0.07)
Basophils Absolute: 0 10*3/uL (ref 0.0–0.1)
Basophils Relative: 0 %
Eosinophils Absolute: 0 10*3/uL (ref 0.0–0.5)
Eosinophils Relative: 0 %
HCT: 42.2 % (ref 39.0–52.0)
Hemoglobin: 14.6 g/dL (ref 13.0–17.0)
Immature Granulocytes: 0 %
Lymphocytes Relative: 13 %
Lymphs Abs: 1.1 10*3/uL (ref 0.7–4.0)
MCH: 30.7 pg (ref 26.0–34.0)
MCHC: 34.6 g/dL (ref 30.0–36.0)
MCV: 88.8 fL (ref 80.0–100.0)
Monocytes Absolute: 0.8 10*3/uL (ref 0.1–1.0)
Monocytes Relative: 9 %
Neutro Abs: 6.9 10*3/uL (ref 1.7–7.7)
Neutrophils Relative %: 78 %
Platelets: 294 10*3/uL (ref 150–400)
RBC: 4.75 MIL/uL (ref 4.22–5.81)
RDW: 11.9 % (ref 11.5–15.5)
WBC: 8.9 10*3/uL (ref 4.0–10.5)
nRBC: 0 % (ref 0.0–0.2)

## 2019-06-11 LAB — BASIC METABOLIC PANEL
Anion gap: 11 (ref 5–15)
BUN: 14 mg/dL (ref 8–23)
CO2: 20 mmol/L — ABNORMAL LOW (ref 22–32)
Calcium: 9.2 mg/dL (ref 8.9–10.3)
Chloride: 105 mmol/L (ref 98–111)
Creatinine, Ser: 1.22 mg/dL (ref 0.61–1.24)
GFR calc Af Amer: >60 mL/min (ref >60)
GFR calc non Af Amer: >60 mL/min (ref >60)
Glucose, Bld: 152 mg/dL — ABNORMAL HIGH (ref 70–99)
Potassium: 4.3 mmol/L (ref 3.5–5.1)
Sodium: 136 mmol/L (ref 135–145)

## 2019-06-11 LAB — HEPATIC FUNCTION PANEL
ALT: 191 U/L — ABNORMAL HIGH (ref 0–44)
AST: 200 U/L — ABNORMAL HIGH (ref 15–41)
Albumin: 4 g/dL (ref 3.5–5.0)
Alkaline Phosphatase: 100 U/L (ref 38–126)
Bilirubin, Direct: 0.2 mg/dL (ref 0.0–0.2)
Indirect Bilirubin: 1.5 mg/dL — ABNORMAL HIGH (ref 0.3–0.9)
Total Bilirubin: 1.7 mg/dL — ABNORMAL HIGH (ref 0.3–1.2)
Total Protein: 6.6 g/dL (ref 6.5–8.1)

## 2019-06-11 LAB — GLUCOSE, CAPILLARY
Glucose-Capillary: 148 mg/dL — ABNORMAL HIGH (ref 70–99)
Glucose-Capillary: 127 mg/dL — ABNORMAL HIGH (ref 70–99)
Glucose-Capillary: 111 mg/dL — ABNORMAL HIGH (ref 70–99)
Glucose-Capillary: 86 mg/dL (ref 70–99)
Glucose-Capillary: 108 mg/dL — ABNORMAL HIGH (ref 70–99)

## 2019-06-11 LAB — TROPONIN I (HIGH SENSITIVITY)
Troponin I (High Sensitivity): 12 ng/L (ref <18)
Troponin I (High Sensitivity): 10 ng/L (ref <18)

## 2019-06-11 LAB — SARS CORONAVIRUS 2 (TAT 6-24 HRS): SARS Coronavirus 2: NEGATIVE

## 2019-06-11 LAB — HIV ANTIBODY (ROUTINE TESTING W REFLEX): HIV Screen 4th Generation wRfx: NONREACTIVE

## 2019-06-11 MED ORDER — LORAZEPAM 2 MG/ML IJ SOLN
1.0000 mg | Freq: Four times a day (QID) | INTRAMUSCULAR | Status: DC | PRN
  Administered 2019-06-11: 1 mg via INTRAVENOUS
  Filled 2019-06-11 (×2): qty 1

## 2019-06-11 MED ORDER — ENOXAPARIN SODIUM 40 MG/0.4ML ~~LOC~~ SOLN
40.0000 mg | SUBCUTANEOUS | Status: DC
  Administered 2019-06-11: 40 mg via SUBCUTANEOUS
  Filled 2019-06-11: qty 0.4

## 2019-06-11 MED ORDER — PANTOPRAZOLE SODIUM 40 MG IV SOLR: 40.0000 mg | INTRAVENOUS | Status: DC

## 2019-06-11 MED ORDER — LORAZEPAM 1 MG PO TABS: 1.0000 mg | ORAL_TABLET | Freq: Four times a day (QID) | ORAL | Status: DC | PRN

## 2019-06-11 MED ORDER — ONDANSETRON HCL 4 MG PO TABS
4.0000 mg | ORAL_TABLET | Freq: Four times a day (QID) | ORAL | Status: DC | PRN
  Administered 2019-06-11 (×2): 4 mg via ORAL
  Filled 2019-06-11 (×3): qty 1

## 2019-06-11 MED ORDER — ACETAMINOPHEN 325 MG PO TABS
650.0000 mg | ORAL_TABLET | Freq: Four times a day (QID) | ORAL | Status: DC | PRN
  Administered 2019-06-11 – 2019-06-12 (×4): 650 mg via ORAL
  Filled 2019-06-11 (×5): qty 2

## 2019-06-11 MED ORDER — SODIUM CHLORIDE 0.9 % IV SOLN
INTRAVENOUS | Status: DC
  Administered 2019-06-11 (×2): via INTRAVENOUS

## 2019-06-11 MED ORDER — VITAMIN B-1 100 MG PO TABS
100.0000 mg | ORAL_TABLET | Freq: Every day | ORAL | Status: DC
  Administered 2019-06-11 – 2019-06-12 (×2): 100 mg via ORAL
  Filled 2019-06-11 (×2): qty 1

## 2019-06-11 MED ORDER — ADULT MULTIVITAMIN W/MINERALS CH
1.0000 | ORAL_TABLET | Freq: Every day | ORAL | Status: DC
  Administered 2019-06-11 – 2019-06-12 (×2): 1 via ORAL
  Filled 2019-06-11 (×2): qty 1

## 2019-06-11 MED ORDER — PANTOPRAZOLE SODIUM 40 MG IV SOLR
40.0000 mg | INTRAVENOUS | Status: DC
  Administered 2019-06-11: 40 mg via INTRAVENOUS
  Filled 2019-06-11: qty 40

## 2019-06-11 MED ORDER — ACETAMINOPHEN 650 MG RE SUPP: 650.0000 mg | Freq: Four times a day (QID) | RECTAL | Status: DC | PRN

## 2019-06-11 MED ORDER — FOLIC ACID 1 MG PO TABS
1.0000 mg | ORAL_TABLET | Freq: Every day | ORAL | Status: DC
  Administered 2019-06-11 – 2019-06-12 (×2): 1 mg via ORAL
  Filled 2019-06-11 (×2): qty 1

## 2019-06-11 MED ORDER — THIAMINE HCL 100 MG/ML IJ SOLN
100.0000 mg | Freq: Every day | INTRAMUSCULAR | Status: DC
  Filled 2019-06-11: qty 2

## 2019-06-11 MED ORDER — ONDANSETRON HCL 4 MG/2ML IJ SOLN
4.0000 mg | Freq: Four times a day (QID) | INTRAMUSCULAR | Status: DC | PRN
  Administered 2019-06-11 – 2019-06-12 (×2): 4 mg via INTRAVENOUS
  Filled 2019-06-11 (×2): qty 2

## 2019-06-11 NOTE — ED Provider Notes (Signed)
68 year old male received a signout from [name] pending admission.  Patient was previously seen by PA Marelli earlier in the day.  Per his HPI:  "Jacob Le is a 68 y.o. male with history of STEMI, pericarditis, pancreatitis, second-degree heart block presents today for evaluation of epigastric pain.  Patient reports that at 6 AM this morning he had a gradual onset of burning epigastric pain consistent with prior pancreatitis bouts.  He reports that pain has increased throughout the day and is now a severe in intensity constant pain worsened with palpation and without alleviating factors.  He has attempted ibuprofen without relief of his pain.  He attributes this current pancreatitis flare to his increased alcohol intake during quarantine.  Additionally patient endorsing mid upper back pain between the scapula reports his pain has been present for 3 days, he feels he may have slept wrong he has been treating this with ibuprofen with relief.  He reports some increase in the mid upper back pain today but does not feel this is related to his epigastric pain, he feels this is a separate pain today.  Patient denies fever/chills, headache/vision changes, neck pain, numbness/weakness, tingling, chest pain/shortness of breath, cough, extremity pain/swelling, nausea/vomiting, diarrhea, dysuria/hematuria or any additional concerns."  68 year old male with history of STEMI, pericarditis, pancreatitis, second-degree heart block received a signout from [name] pending admission for intractable vomiting.  Please see prior notes for further work-up and medical decision making.  Consult to the hospitalist team and Dr. Hal Hope will accept the patient. The patient appears reasonably stabilized for admission considering the current resources, flow, and capabilities available in the ED at this time, and I doubt any other [facility] requiring further screening and/or treatment in the ED prior to admission.     [name], PA-C [id number] [phone]    [name], MD [id number] [phone]

## 2019-06-11 NOTE — Progress Notes (Addendum)
Patient admitted after midnight but care started in the ER prior to midnight, please see H&P.  Will get RUQ U/S.  Patient says he has had a cholecystectomy (not in our history so will update).  After RUQ U/S will start clears and then plan to advance diet as tolerated for pancreatitis vs gastritis.  Patient does endorse drinking more alcohol over the last 6 months.     -follows with [facility]-- per patient the MD had wanted to do an ERCP in the past after another episode but he has not had it done yet. Has failed the observation period as he needs continued IVF and slow advancement of diet therefore will change to inpatient  Jacob Le

## 2019-06-11 NOTE — H&P (Signed)
History and Physical    Yorel Obarr [id number] DOB: 1951/03/03 DOA: 06/10/2019  PCP: Elby Showers, MD  Patient coming from: Home.  Chief Complaint: Epigastric pain.  HPI: Needham Macgowan is a 68 y.o. male with history of hyperlipidemia pericarditis previous history of pancreatitis with alcohol abuse presents to the ER because of worsening epigastric pain.  Patient states his pain started yesterday morning.  Mostly in the epigastric area radiating to back.  Because of the worsening pain patient came to the ER.  Denies any chest pain shortness of breath fever chills per denies any nausea or vomiting or diarrhea prior to coming.  ED Course: In the ER labs revealed mildly elevated lipase at 68 normal LFTs.  Troponins were negative EKG was showing normal sinus rhythm.  Patient underwent CT angiogram of the chest abdomen pelvis which was negative.  For pain patient was given multiple dose of pain relief medication started throwing up multiple times.  No blood in the vomitus.  Patient admitted for further management of intractable nausea vomiting with abdominal pain.  Patient admits to drinking alcohol every day recently more than usual.  Review of Systems: As per HPI, rest all negative.   Past Medical History:  Diagnosis Date   Dysrhythmia    2nd degree heart block, Type 1   Sleep apnea     Past Surgical History:  Procedure Laterality Date   broken left shoulder     LEFT HEART CATH AND CORONARY ANGIOGRAPHY N/A 12/18/2017   Procedure: LEFT HEART CATH AND CORONARY ANGIOGRAPHY;  Surgeon: Burnell Blanks, MD;  Location: [facility];  Service: Cardiovascular;  Laterality: N/A;   pancreatis attack     pericarditis     torn right rotator cuff       reports that he has never smoked. He has never used smokeless tobacco. He reports current alcohol use. He reports that he does not use drugs.  Allergies  Allergen Reactions   Codeine Nausea And Vomiting   Other  Nausea And Vomiting    Any narcotic    Family History  Problem Relation Age of Onset   Cancer Mother     Prior to Admission medications   Medication Sig Start Date End Date Taking? Authorizing Provider  aspirin EC 81 MG EC tablet Take 1 tablet (81 mg total) by mouth daily. Patient taking differently: Take 81 mg by mouth every evening.  12/19/17  Yes Bhagat, Bhavinkumar, PA  co-enzyme Q-10 30 MG capsule Take 30 mg by mouth daily.   Yes [provider]  rosuvastatin (CRESTOR) 5 MG tablet Take 1 tablet (5 mg total) by mouth daily. Patient taking differently: Take 5 mg by mouth every evening.  03/27/19  Yes Baxley, Cresenciano Lick, MD  esomeprazole (NEXIUM) 40 MG capsule Take 1 capsule (40 mg total) by mouth daily. 06/10/19   Margarita Mail, PA-C  ibuprofen (ADVIL,MOTRIN) 800 MG tablet Take 1 tablet (800 mg total) by mouth 3 (three) times daily with meals. Patient not taking: Reported on 06/11/2019 12/18/17   Leanor Kail, PA  lidocaine (LIDODERM) 5 % Place 1 patch onto the skin daily. Remove & Discard patch within 12 hours or as directed by MD 06/10/19   Nuala Alpha A, PA-C  ondansetron (ZOFRAN ODT) 4 MG disintegrating tablet Take 1 tablet (4 mg total) by mouth every 8 (eight) hours as needed for nausea or vomiting. 06/10/19   Nuala Alpha A, PA-C  oxyCODONE-acetaminophen (PERCOCET/ROXICET) 5-325 MG tablet Take 1 tablet by  mouth every 6 (six) hours as needed for severe pain. 06/10/19   Nuala Alpha A, PA-C  sucralfate (CARAFATE) 1 g tablet Take 1 tablet (1 g total) by mouth 4 (four) times daily -  with meals and at bedtime. 06/10/19   Margarita Mail, PA-C    Physical Exam: Constitutional: Moderately built and nourished. Vitals:   06/11/19 0215 06/11/19 0230 06/11/19 0245 06/11/19 0323  BP: (!) 127/48 (!) 131/49 (!) 126/48 (!) 158/81  Pulse:    78  Resp: 16 14 15 16   Temp:    98 F (36.7 C)  TempSrc:    Oral  SpO2: 94% 96% 95% 98%   Eyes: Anicteric no pallor. ENMT: No  discharge from the ears eyes nose or mouth. Neck: No mass felt.  No neck rigidity. Respiratory: No rhonchi or crepitations. Cardiovascular: S1-S2 heard. Abdomen: Soft nontender bowel sounds present. Musculoskeletal: No edema.  No joint effusion. Skin: No rash. Neurologic: Alert awake oriented to time place and person.  Moves all extremities. Psychiatric: Appears normal.   Labs on Admission: I have personally reviewed following labs and imaging studies  CBC: Recent Labs  Lab 06/10/19 1320  WBC 9.1  HGB 15.6  HCT 44.6  MCV 88.8  PLT [id number]   Basic Metabolic Panel: Recent Labs  Lab 06/10/19 1320  NA 139  K 4.2  CL 108  CO2 21*  GLUCOSE 139*  BUN 20  CREATININE 1.18  CALCIUM 9.3   GFR: CrCl cannot be calculated (Unknown ideal weight.). Liver Function Tests: Recent Labs  Lab 06/10/19 1320  AST 25  ALT 35  ALKPHOS 68  BILITOT 1.2  PROT 7.3  ALBUMIN 4.5   Recent Labs  Lab 06/10/19 1320  LIPASE 68*   No results for input(s): AMMONIA in the last 168 hours. Coagulation Profile: No results for input(s): INR, PROTIME in the last 168 hours. Cardiac Enzymes: No results for input(s): CKTOTAL, CKMB, CKMBINDEX, TROPONINI in the last 168 hours. BNP (last 3 results) No results for input(s): PROBNP in the last 8760 hours. HbA1C: No results for input(s): HGBA1C in the last 72 hours. CBG: No results for input(s): GLUCAP in the last 168 hours. Lipid Profile: No results for input(s): CHOL, HDL, LDLCALC, TRIG, CHOLHDL, LDLDIRECT in the last 72 hours. Thyroid Function Tests: No results for input(s): TSH, T4TOTAL, FREET4, T3FREE, THYROIDAB in the last 72 hours. Anemia Panel: No results for input(s): VITAMINB12, FOLATE, FERRITIN, TIBC, IRON, RETICCTPCT in the last 72 hours. Urine analysis:    Component Value Date/Time   COLORURINE YELLOW 06/10/2019 1740   APPEARANCEUR CLEAR 06/10/2019 1740   LABSPEC 1.018 06/10/2019 1740   PHURINE 5.0 06/10/2019 1740   GLUCOSEU NEGATIVE  06/10/2019 1740   HGBUR SMALL (A) 06/10/2019 1740   BILIRUBINUR NEGATIVE 06/10/2019 1740   BILIRUBINUR NEG 04/14/2019 0846   KETONESUR 20 (A) 06/10/2019 1740   PROTEINUR NEGATIVE 06/10/2019 1740   UROBILINOGEN 0.2 04/14/2019 0846   NITRITE NEGATIVE 06/10/2019 1740   LEUKOCYTESUR NEGATIVE 06/10/2019 1740   Sepsis Labs: @LABRCNTIP (procalcitonin:4,lacticidven:4) )No results found for this or any previous visit (from the past 240 hour(s)).   Radiological Exams on Admission: Dg Chest Portable 1 View  Result Date: 06/10/2019 CLINICAL DATA:  Epigastric abdominal pain. EXAM: PORTABLE CHEST 1 VIEW COMPARISON:  Radiographs of December 17, 2017. FINDINGS: The heart size and mediastinal contours are within normal limits. Both lungs are clear. No pneumothorax or pleural effusion is noted. The visualized skeletal structures are unremarkable. IMPRESSION: No active disease. Electronically Signed  By: Marijo Conception M.D.   On: 06/10/2019 19:38   Ct Angio Chest/abd/pel For Dissection W And/or Wo Contrast  Result Date: 06/10/2019 CLINICAL DATA:  Chest and back pain EXAM: CT ANGIOGRAPHY CHEST, ABDOMEN AND PELVIS TECHNIQUE: Multidetector CT imaging through the chest, abdomen and pelvis was performed using the standard protocol during bolus administration of intravenous contrast. Multiplanar reconstructed images and MIPs were obtained and reviewed to evaluate the vascular anatomy. CONTRAST:  183mL OMNIPAQUE IOHEXOL 350 MG/ML SOLN COMPARISON:  CTA chest, abdomen and pelvis 01/10/2018 FINDINGS: CTA CHEST FINDINGS Cardiovascular: No filling defects in the pulmonary arteries to suggest pulmonary emboli. Heart is normal size. Aorta is normal caliber with scattered calcifications. No dissection. Scattered coronary artery calcifications in the left anterior descending coronary artery. Mediastinum/Nodes: No mediastinal, hilar, or axillary adenopathy. Small hiatal hernia. Trachea and esophagus are unremarkable. Lungs/Pleura:  Lungs are clear. No focal airspace opacities or suspicious nodules. No effusions. Musculoskeletal: Chest wall soft tissues are unremarkable. No acute bony abnormality. Review of the MIP images confirms the above findings. CTA ABDOMEN AND PELVIS FINDINGS VASCULAR Aorta: Scattered aortic calcifications.  No aneurysm or dissection. Celiac: Patent without evidence of aneurysm, dissection, vasculitis or significant stenosis. SMA: Patent without evidence of aneurysm, dissection, vasculitis or significant stenosis. Renals: Both renal arteries are patent without evidence of aneurysm, dissection, vasculitis, fibromuscular dysplasia or significant stenosis. IMA: Patent without evidence of aneurysm, dissection, vasculitis or significant stenosis. Inflow: Patent without evidence of aneurysm, dissection, vasculitis or significant stenosis. Veins: No obvious venous abnormality within the limitations of this arterial phase study. Review of the MIP images confirms the above findings. NON-VASCULAR Hepatobiliary: No focal liver abnormality is seen. Status post cholecystectomy. No biliary dilatation. Pancreas: No focal abnormality or ductal dilatation. Spleen: No focal abnormality.  Normal size. Adrenals/Urinary Tract: No adrenal abnormality. No focal renal abnormality. No stones or hydronephrosis. Urinary bladder is unremarkable. Stomach/Bowel: Sigmoid diverticulosis. No active diverticulitis. Stomach and small bowel decompressed, unremarkable. Lymphatic: No adenopathy. Reproductive: Prostate enlargement with central calcifications. Other: No free fluid or free air. Bilateral inguinal hernias containing fat. Musculoskeletal: No acute bony abnormality. Review of the MIP images confirms the above findings. IMPRESSION: No evidence of aortic aneurysm or dissection. No evidence of pulmonary embolus. Scattered aortic calcifications. Coronary artery disease. Small hiatal hernia. Sigmoid diverticulosis.  No active diverticulitis. Small  bilateral inguinal hernias containing fat. Prostate enlargement. No acute findings. Electronically Signed   By: Rolm Baptise M.D.   On: 06/10/2019 20:01    EKG: Independently reviewed.  Normal sinus rhythm.  Arrhythmia with first-degree AV block.  Assessment/Plan Principal Problem:   Nausea & vomiting Active Problems:   OSA on CPAP   Abdominal pain    1. Intractable nausea vomiting with epigastric pain with mild elevated lipase could be from pancreatitis.  We will keep patient on n.p.o. hydration and also on IV Protonix for possibility of alcoholic gastritis.  Recheck LFTs and metabolic panel.  2. Alcohol abuse we will keep patient on CIWA protocol.. 3. Hyperlipidemia on statins. 4. History of sleep apnea on CPAP. 5. Previous history of pericarditis.   DVT prophylaxis: SCDs for now. Code Status: Full code. Family Communication: Discussed with patient's wife. Disposition Plan: Home. Consults called: None. Admission status: Observation   Rise Patience MD [facility] Pager [phone].  If 7PM-7AM, please contact night-coverage [email or web address] Password [facility]  06/11/2019, 3:34 AM

## 2019-06-11 NOTE — Plan of Care (Signed)

## 2019-06-11 NOTE — ED Notes (Signed)
Attempted report x1. 

## 2019-06-11 NOTE — Progress Notes (Signed)
Patient went into 2nd degree heart block type I and HR dropped to the 40s.  He has a history of this dysrhythmia. Pt was asymptomatic and was sleeping. MD has been paged.

## 2019-06-11 NOTE — ED Notes (Signed)
ED TO INPATIENT HANDOFF REPORT  ED Nurse Name and Phone #: Annie Main [phone]  S Name/Age/Gender Jacob Le 68 y.o. male Room/Bed: 038C/038C  Code Status   Code Status: Prior  Home/SNF/Other Home Patient oriented to: self, place, time and situation Is this baseline? Yes   Triage Complete: Triage complete  Chief Complaint abd pain  Triage Note Pt states he is having some epigastric pain and radiates into mid back in between shoulder blades. Pt has had a pancreatis flare before and felt somewhat similar. Pt states this pain started suddenly today. Pt states there was an unknown cause for his pancreatitis and has follow up with GI.     Allergies Allergies  Allergen Reactions  . Codeine Nausea And Vomiting    Level of Care/Admitting Diagnosis ED Disposition    ED Disposition Condition Comment   Admit  Hospital Area: [facility] [[id number]]  Level of Care: Telemetry Medical [104]  I expect the patient will be discharged within 24 hours: No (not a candidate for 5C-Observation unit)  Covid Evaluation: Asymptomatic Screening Protocol (No Symptoms)  Diagnosis: Nausea & vomiting [id number]  Admitting Physician: Rise Patience [phone]  Attending Physician: Rise Patience [email or web address]  PT Class (Do Not Modify): Observation [104]  PT Acc Code (Do Not Modify): Observation [10022]       B Medical/Surgery History Past Medical History:  Diagnosis Date  . Dysrhythmia    2nd degree heart block, Type 1  . Sleep apnea    Past Surgical History:  Procedure Laterality Date  . broken left shoulder    . LEFT HEART CATH AND CORONARY ANGIOGRAPHY N/A 12/18/2017   Procedure: LEFT HEART CATH AND CORONARY ANGIOGRAPHY;  Surgeon: Burnell Blanks, MD;  Location: [facility];  Service: Cardiovascular;  Laterality: N/A;  . pancreatis attack    . pericarditis    . torn right rotator cuff       A IV Location/Drains/Wounds Patient Lines/Drains/Airways Status    Active Line/Drains/Airways    Name:   Placement date:   Placement time:   Site:   Days:   Peripheral IV 06/10/19 Right Hand   06/10/19    2316    Hand   1          Intake/Output Last 24 hours No intake or output data in the 24 hours ending 06/11/19 0136  Labs/Imaging Results for orders placed or performed during the hospital encounter of 06/10/19 (from the past 48 hour(s))  Lipase, blood     Status: Abnormal   Collection Time: 06/10/19  1:20 PM  Result Value Ref Range   Lipase 68 (H) 11 - 51 U/L    Comment: Performed at [facility] [address] [place], [place] [address]  Comprehensive metabolic panel     Status: Abnormal   Collection Time: 06/10/19  1:20 PM  Result Value Ref Range   Sodium 139 135 - 145 mmol/L   Potassium 4.2 3.5 - 5.1 mmol/L   Chloride 108 98 - 111 mmol/L   CO2 21 (L) 22 - 32 mmol/L   Glucose, Bld 139 (H) 70 - 99 mg/dL   BUN 20 8 - 23 mg/dL   Creatinine, Ser 1.18 0.61 - 1.24 mg/dL   Calcium 9.3 8.9 - 10.3 mg/dL   Total Protein 7.3 6.5 - 8.1 g/dL   Albumin 4.5 3.5 - 5.0 g/dL   AST 25 15 - 41 U/L   ALT 35 0 - 44 U/L  Alkaline Phosphatase 68 38 - 126 U/L   Total Bilirubin 1.2 0.3 - 1.2 mg/dL   GFR calc non Af Amer >60 >60 mL/min   GFR calc Af Amer >60 >60 mL/min   Anion gap 10 5 - 15    Comment: Performed at [facility] [address], [place] [address]  CBC     Status: None   Collection Time: 06/10/19  1:20 PM  Result Value Ref Range   WBC 9.1 4.0 - 10.5 K/uL   RBC 5.02 4.22 - 5.81 MIL/uL   Hemoglobin 15.6 13.0 - 17.0 g/dL   HCT 44.6 39.0 - 52.0 %   MCV 88.8 80.0 - 100.0 fL   MCH 31.1 26.0 - 34.0 pg   MCHC 35.0 30.0 - 36.0 g/dL   RDW 12.1 11.5 - 15.5 %   Platelets 279 150 - 400 K/uL   nRBC 0.0 0.0 - 0.2 %    Comment: Performed at [facility], [place] [address], [place], [place] [address]  Urinalysis, Routine w reflex microscopic     Status: Abnormal   Collection Time: 06/10/19  5:40 PM  Result Value Ref Range    Color, Urine YELLOW YELLOW   APPearance CLEAR CLEAR   Specific Gravity, Urine 1.018 1.005 - 1.030   pH 5.0 5.0 - 8.0   Glucose, UA NEGATIVE NEGATIVE mg/dL   Hgb urine dipstick SMALL (A) NEGATIVE   Bilirubin Urine NEGATIVE NEGATIVE   Ketones, ur 20 (A) NEGATIVE mg/dL   Protein, ur NEGATIVE NEGATIVE mg/dL   Nitrite NEGATIVE NEGATIVE   Leukocytes,Ua NEGATIVE NEGATIVE   RBC / HPF 6-10 0 - 5 RBC/hpf   WBC, UA 0-5 0 - 5 WBC/hpf   Bacteria, UA NONE SEEN NONE SEEN   Mucus PRESENT     Comment: Performed at [facility] [address], [place], [place] [address]  Troponin I (High Sensitivity)     Status: None   Collection Time: 06/10/19  6:59 PM  Result Value Ref Range   Troponin I (High Sensitivity) 9 <18 ng/L    Comment: (NOTE) Elevated high sensitivity troponin I (hsTnI) values and significant  changes across serial measurements may suggest ACS but many other  chronic and acute conditions are known to elevate hsTnI results.  Refer to the "Links" section for chest pain algorithms and additional  guidance. Performed at [facility], [place] [address], [place], [place] [address]   Troponin I (High Sensitivity)     Status: None   Collection Time: 06/10/19  8:13 PM  Result Value Ref Range   Troponin I (High Sensitivity) 10 <18 ng/L    Comment: (NOTE) Elevated high sensitivity troponin I (hsTnI) values and significant  changes across serial measurements may suggest ACS but many other  chronic and acute conditions are known to elevate hsTnI results.  Refer to the "Links" section for chest pain algorithms and additional  guidance. Performed at [facility], [place] [address], [place], [place] [address]    Dg Chest Portable 1 View  Result Date: 06/10/2019 CLINICAL DATA:  Epigastric abdominal pain. EXAM: PORTABLE CHEST 1 VIEW COMPARISON:  Radiographs of December 17, 2017. FINDINGS: The heart size and mediastinal contours are within normal limits. Both lungs are clear. No  pneumothorax or pleural effusion is noted. The visualized skeletal structures are unremarkable. IMPRESSION: No active disease. Electronically Signed   By: Marijo Conception M.D.   On: 06/10/2019 19:38   Ct  Angio Chest/abd/pel For Dissection W And/or Wo Contrast  Result Date: 06/10/2019 CLINICAL DATA:  Chest and back pain EXAM: CT ANGIOGRAPHY CHEST, ABDOMEN AND PELVIS TECHNIQUE: Multidetector CT imaging through the chest, abdomen and pelvis was performed using the standard protocol during bolus administration of intravenous contrast. Multiplanar reconstructed images and MIPs were obtained and reviewed to evaluate the vascular anatomy. CONTRAST:  154mL OMNIPAQUE IOHEXOL 350 MG/ML SOLN COMPARISON:  CTA chest, abdomen and pelvis 01/10/2018 FINDINGS: CTA CHEST FINDINGS Cardiovascular: No filling defects in the pulmonary arteries to suggest pulmonary emboli. Heart is normal size. Aorta is normal caliber with scattered calcifications. No dissection. Scattered coronary artery calcifications in the left anterior descending coronary artery. Mediastinum/Nodes: No mediastinal, hilar, or axillary adenopathy. Small hiatal hernia. Trachea and esophagus are unremarkable. Lungs/Pleura: Lungs are clear. No focal airspace opacities or suspicious nodules. No effusions. Musculoskeletal: Chest wall soft tissues are unremarkable. No acute bony abnormality. Review of the MIP images confirms the above findings. CTA ABDOMEN AND PELVIS FINDINGS VASCULAR Aorta: Scattered aortic calcifications.  No aneurysm or dissection. Celiac: Patent without evidence of aneurysm, dissection, vasculitis or significant stenosis. SMA: Patent without evidence of aneurysm, dissection, vasculitis or significant stenosis. Renals: Both renal arteries are patent without evidence of aneurysm, dissection, vasculitis, fibromuscular dysplasia or significant stenosis. IMA: Patent without evidence of aneurysm, dissection, vasculitis or significant stenosis. Inflow:  Patent without evidence of aneurysm, dissection, vasculitis or significant stenosis. Veins: No obvious venous abnormality within the limitations of this arterial phase study. Review of the MIP images confirms the above findings. NON-VASCULAR Hepatobiliary: No focal liver abnormality is seen. Status post cholecystectomy. No biliary dilatation. Pancreas: No focal abnormality or ductal dilatation. Spleen: No focal abnormality.  Normal size. Adrenals/Urinary Tract: No adrenal abnormality. No focal renal abnormality. No stones or hydronephrosis. Urinary bladder is unremarkable. Stomach/Bowel: Sigmoid diverticulosis. No active diverticulitis. Stomach and small bowel decompressed, unremarkable. Lymphatic: No adenopathy. Reproductive: Prostate enlargement with central calcifications. Other: No free fluid or free air. Bilateral inguinal hernias containing fat. Musculoskeletal: No acute bony abnormality. Review of the MIP images confirms the above findings. IMPRESSION: No evidence of aortic aneurysm or dissection. No evidence of pulmonary embolus. Scattered aortic calcifications. Coronary artery disease. Small hiatal hernia. Sigmoid diverticulosis.  No active diverticulitis. Small bilateral inguinal hernias containing fat. Prostate enlargement. No acute findings. Electronically Signed   By: Rolm Baptise M.D.   On: 06/10/2019 20:01    Pending Labs Unresulted Labs (From admission, onward)    Start     Ordered   06/11/19 0109  SARS CORONAVIRUS 2 (TAT 6-12 HRS) Nasal Swab Aptima Multi Swab  (Asymptomatic/Tier 2 Patients Labs)  Once,   STAT    Question Answer Comment  Is this test for diagnosis or screening Screening   Symptomatic for COVID-19 as defined by CDC No   Hospitalized for COVID-19 No   Admitted to ICU for COVID-19 No   Previously tested for COVID-19 No   Resident in a congregate (group) care setting No   Employed in healthcare setting No      06/11/19 0110          Vitals/Pain Today's Vitals    06/10/19 2230 06/10/19 2237 06/11/19 0115 06/11/19 0130  BP: 135/74  (!) 138/48 (!) 137/46  Pulse: 78     Resp: 16     Temp:      TempSrc:      SpO2: 97%  96% 95%  PainSc:  0-No pain      Isolation Precautions No active  isolations  Medications Medications  sodium chloride flush (NS) 0.9 % injection 3 mL (3 mLs Intravenous Given 06/10/19 2331)  sodium chloride 0.9 % bolus 500 mL (500 mLs Intravenous New Bag/Given 06/10/19 2142)  morphine 4 MG/ML injection 4 mg (4 mg Intravenous Given 06/10/19 1900)  iohexol (OMNIPAQUE) 350 MG/ML injection 100 mL (100 mLs Intravenous Contrast Given 06/10/19 1941)  alum & mag hydroxide-simeth (MAALOX/MYLANTA) 200-200-20 MG/5ML suspension 30 mL (30 mLs Oral Given 06/10/19 2030)  HYDROcodone-acetaminophen (NORCO/VICODIN) 5-325 MG per tablet 2 tablet (2 tablets Oral Given 06/10/19 2100)  ondansetron (ZOFRAN-ODT) disintegrating tablet 4 mg (4 mg Oral Given 06/10/19 2100)  famotidine (PEPCID) IVPB 20 mg premix (0 mg Intravenous Stopped 06/10/19 2316)  sucralfate (CARAFATE) tablet 1 g (1 g Oral Given 06/10/19 2149)  morphine 4 MG/ML injection 4 mg (4 mg Intravenous Given 06/10/19 2149)  pantoprazole (PROTONIX) injection 40 mg (40 mg Intravenous Given 06/10/19 2331)    Mobility walks Low fall risk   Focused Assessments   R Recommendations: See Admitting Provider Note  Report given to:   Additional Notes:

## 2019-06-12 DIAGNOSIS — K2900 Acute gastritis without bleeding: Secondary | ICD-10-CM

## 2019-06-12 LAB — COMPREHENSIVE METABOLIC PANEL
ALT: 124 U/L — ABNORMAL HIGH (ref 0–44)
AST: 62 U/L — ABNORMAL HIGH (ref 15–41)
Albumin: 4.3 g/dL (ref 3.5–5.0)
Alkaline Phosphatase: 88 U/L (ref 38–126)
Anion gap: 11 (ref 5–15)
BUN: 10 mg/dL (ref 8–23)
CO2: 21 mmol/L — ABNORMAL LOW (ref 22–32)
Calcium: 9.6 mg/dL (ref 8.9–10.3)
Chloride: 107 mmol/L (ref 98–111)
Creatinine, Ser: 1.1 mg/dL (ref 0.61–1.24)
GFR calc Af Amer: >60 mL/min (ref >60)
GFR calc non Af Amer: >60 mL/min (ref >60)
Glucose, Bld: 104 mg/dL — ABNORMAL HIGH (ref 70–99)
Potassium: 4.1 mmol/L (ref 3.5–5.1)
Sodium: 139 mmol/L (ref 135–145)
Total Bilirubin: 1.2 mg/dL (ref 0.3–1.2)
Total Protein: 7.2 g/dL (ref 6.5–8.1)

## 2019-06-12 LAB — CBC
HCT: 44.8 % (ref 39.0–52.0)
Hemoglobin: 15.2 g/dL (ref 13.0–17.0)
MCH: 30.7 pg (ref 26.0–34.0)
MCHC: 33.9 g/dL (ref 30.0–36.0)
MCV: 90.5 fL (ref 80.0–100.0)
Platelets: 281 10*3/uL (ref 150–400)
RBC: 4.95 MIL/uL (ref 4.22–5.81)
RDW: 12.4 % (ref 11.5–15.5)
WBC: 8.4 10*3/uL (ref 4.0–10.5)
nRBC: 0 % (ref 0.0–0.2)

## 2019-06-12 LAB — GLUCOSE, CAPILLARY
Glucose-Capillary: 96 mg/dL (ref 70–99)
Glucose-Capillary: 83 mg/dL (ref 70–99)

## 2019-06-12 MED ORDER — PANTOPRAZOLE SODIUM 40 MG PO TBEC
40.0000 mg | DELAYED_RELEASE_TABLET | Freq: Every day | ORAL | Status: DC
  Administered 2019-06-12: 40 mg via ORAL
  Filled 2019-06-12: qty 1

## 2019-06-12 MED ORDER — HYDRALAZINE HCL 20 MG/ML IJ SOLN
10.0000 mg | Freq: Once | INTRAMUSCULAR | Status: AC
  Administered 2019-06-12: 10 mg via INTRAVENOUS

## 2019-06-12 MED ORDER — DICLOFENAC SODIUM 1 % TD GEL: 2.0000 g | Freq: Four times a day (QID) | TRANSDERMAL | 0 refills | Status: DC

## 2019-06-12 MED ORDER — OXYCODONE HCL 5 MG PO TABS
ORAL_TABLET | ORAL | Status: AC
  Filled 2019-06-12: qty 1

## 2019-06-12 MED ORDER — OXYCODONE HCL 5 MG PO TABS
5.0000 mg | ORAL_TABLET | Freq: Once | ORAL | Status: AC
  Administered 2019-06-12: 5 mg via ORAL

## 2019-06-12 MED ORDER — DICLOFENAC SODIUM 1 % TD GEL
2.0000 g | Freq: Four times a day (QID) | TRANSDERMAL | Status: DC
  Administered 2019-06-12: 2 g via TOPICAL
  Filled 2019-06-12: qty 100

## 2019-06-12 MED ORDER — HYDRALAZINE HCL 20 MG/ML IJ SOLN
INTRAMUSCULAR | Status: AC
  Filled 2019-06-12: qty 1

## 2019-06-12 NOTE — Plan of Care (Signed)

## 2019-06-12 NOTE — Progress Notes (Signed)
Discharge instructions given to patient verbalized understanding of instructions given. PIV removed and dry dressing applied catheter was whole with removal. Tele removed informed CCMD of removal. Patient instructed to call for his ride home.

## 2019-06-12 NOTE — Discharge Summary (Signed)
Physician Discharge Summary  Jacob Le [id number] DOB: 03-21-1951 DOA: 06/10/2019  PCP: Elby Showers, MD  Admit date: 06/10/2019 Discharge date: 06/12/2019  Admitted From: home Discharge disposition: home   Recommendations for Outpatient Follow-Up:   1. Alcohol cessation 2. LFTs at next office visit   Discharge Diagnosis:   Principal Problem:   Nausea & vomiting Active Problems:   OSA on CPAP   Abdominal pain   Acute pancreatitis    Discharge Condition: Improved.  Diet recommendation: Low sodium, heart healthy.  Carbohydrate-modified  Wound care: None.  Code status: Full.   History of Present Illness:   Jacob Le is a 68 y.o. male with history of hyperlipidemia pericarditis previous history of pancreatitis with alcohol abuse presents to the ER because of worsening epigastric pain.  Patient states his pain started yesterday morning.  Mostly in the epigastric area radiating to back.  Because of the worsening pain patient came to the ER.  Denies any chest pain shortness of breath fever chills per denies any nausea or vomiting or diarrhea prior to coming.   Hospital Course by Problem:   Gastritis vs mild pancreatitis-- suspect gastritis more likely -much improved on PPI and with bowel rest -RUQ [place] negative for increased duct size/some hepatic steatosis -has had increased alcohol amounts over the last 6 months-- he will work on reduction -outpatient GI follow up  Back pain -improved with voltren gel  Elevated BP -associated with back pain/anxiety about back pain    Medical Consultants:      Discharge Exam:   Vitals:   06/12/19 0747 06/12/19 1121  BP: (!) 185/84 120/79  Pulse: 81 67  Resp: 17 16  Temp: 98.6 F (37 C) 98.1 F (36.7 C)  SpO2: 100% 97%   Vitals:   06/12/19 0256 06/12/19 0541 06/12/19 0747 06/12/19 1121  BP: (!) 157/82 (!) 159/77 (!) 185/84 120/79  Pulse:  65 81 67  Resp:  20 17 16   Temp:  98 F (36.7 C) 98.6  F (37 C) 98.1 F (36.7 C)  TempSrc:  Oral Oral Oral  SpO2:  99% 100% 97%  Weight:  98.4 kg    Height:        General exam: Appears calm and comfortable. Pain much improved with voltaren gel  The results of significant diagnostics from this hospitalization (including imaging, microbiology, ancillary and laboratory) are listed below for reference.     Procedures and Diagnostic Studies:   Dg Chest Portable 1 View  Result Date: 06/10/2019 CLINICAL DATA:  Epigastric abdominal pain. EXAM: PORTABLE CHEST 1 VIEW COMPARISON:  Radiographs of December 17, 2017. FINDINGS: The heart size and mediastinal contours are within normal limits. Both lungs are clear. No pneumothorax or pleural effusion is noted. The visualized skeletal structures are unremarkable. IMPRESSION: No active disease. Electronically Signed   By: Marijo Conception M.D.   On: 06/10/2019 19:38   Ct Angio Chest/abd/pel For Dissection W And/or Wo Contrast  Result Date: 06/10/2019 CLINICAL DATA:  Chest and back pain EXAM: CT ANGIOGRAPHY CHEST, ABDOMEN AND PELVIS TECHNIQUE: Multidetector CT imaging through the chest, abdomen and pelvis was performed using the standard protocol during bolus administration of intravenous contrast. Multiplanar reconstructed images and MIPs were obtained and reviewed to evaluate the vascular anatomy. CONTRAST:  165mL OMNIPAQUE IOHEXOL 350 MG/ML SOLN COMPARISON:  CTA chest, abdomen and pelvis 01/10/2018 FINDINGS: CTA CHEST FINDINGS Cardiovascular: No filling defects in the pulmonary arteries to suggest pulmonary emboli. Heart is normal size.  Aorta is normal caliber with scattered calcifications. No dissection. Scattered coronary artery calcifications in the left anterior descending coronary artery. Mediastinum/Nodes: No mediastinal, hilar, or axillary adenopathy. Small hiatal hernia. Trachea and esophagus are unremarkable. Lungs/Pleura: Lungs are clear. No focal airspace opacities or suspicious nodules. No effusions.  Musculoskeletal: Chest wall soft tissues are unremarkable. No acute bony abnormality. Review of the MIP images confirms the above findings. CTA ABDOMEN AND PELVIS FINDINGS VASCULAR Aorta: Scattered aortic calcifications.  No aneurysm or dissection. Celiac: Patent without evidence of aneurysm, dissection, vasculitis or significant stenosis. SMA: Patent without evidence of aneurysm, dissection, vasculitis or significant stenosis. Renals: Both renal arteries are patent without evidence of aneurysm, dissection, vasculitis, fibromuscular dysplasia or significant stenosis. IMA: Patent without evidence of aneurysm, dissection, vasculitis or significant stenosis. Inflow: Patent without evidence of aneurysm, dissection, vasculitis or significant stenosis. Veins: No obvious venous abnormality within the limitations of this arterial phase study. Review of the MIP images confirms the above findings. NON-VASCULAR Hepatobiliary: No focal liver abnormality is seen. Status post cholecystectomy. No biliary dilatation. Pancreas: No focal abnormality or ductal dilatation. Spleen: No focal abnormality.  Normal size. Adrenals/Urinary Tract: No adrenal abnormality. No focal renal abnormality. No stones or hydronephrosis. Urinary bladder is unremarkable. Stomach/Bowel: Sigmoid diverticulosis. No active diverticulitis. Stomach and small bowel decompressed, unremarkable. Lymphatic: No adenopathy. Reproductive: Prostate enlargement with central calcifications. Other: No free fluid or free air. Bilateral inguinal hernias containing fat. Musculoskeletal: No acute bony abnormality. Review of the MIP images confirms the above findings. IMPRESSION: No evidence of aortic aneurysm or dissection. No evidence of pulmonary embolus. Scattered aortic calcifications. Coronary artery disease. Small hiatal hernia. Sigmoid diverticulosis.  No active diverticulitis. Small bilateral inguinal hernias containing fat. Prostate enlargement. No acute findings.  Electronically Signed   By: Rolm Baptise M.D.   On: 06/10/2019 20:01   US Abdomen Limited Ruq  Result Date: 06/11/2019 CLINICAL DATA:  Elevated LFTs EXAM: ULTRASOUND ABDOMEN LIMITED RIGHT UPPER QUADRANT COMPARISON:  None. FINDINGS: Gallbladder: Prior cholecystectomy. Common bile duct: Diameter: 5.9 mm Liver: . No focal lesion identified. Increased hepatic parenchymal echogenicity. Portal vein is patent on color Doppler imaging with normal direction of blood flow towards the liver. Other: None. IMPRESSION: Increased hepatic echogenicity as can be seen with hepatic steatosis. Electronically Signed   By: Kathreen Devoid   On: 06/11/2019 11:21     Labs:   Basic Metabolic Panel: Recent Labs  Lab 06/10/19 1320 06/11/19 0344 06/12/19 0658  NA 139 136 139  K 4.2 4.3 4.1  CL 108 105 107  CO2 21* 20* 21*  GLUCOSE 139* 152* 104*  BUN 20 14 10   CREATININE 1.18 1.22 1.10  CALCIUM 9.3 9.2 9.6   GFR Estimated Creatinine Clearance: 72.1 mL/min (by C-G formula based on SCr of 1.1 mg/dL). Liver Function Tests: Recent Labs  Lab 06/10/19 1320 06/11/19 0344 06/12/19 0658  AST 25 200* 62*  ALT 35 191* 124*  ALKPHOS 68 100 88  BILITOT 1.2 1.7* 1.2  PROT 7.3 6.6 7.2  ALBUMIN 4.5 4.0 4.3   Recent Labs  Lab 06/10/19 1320  LIPASE 68*   No results for input(s): AMMONIA in the last 168 hours. Coagulation profile No results for input(s): INR, PROTIME in the last 168 hours.  CBC: Recent Labs  Lab 06/10/19 1320 06/11/19 0344 06/12/19 0658  WBC 9.1 8.9 8.4  NEUTROABS  --  6.9  --   HGB 15.6 14.6 15.2  HCT 44.6 42.2 44.8  MCV 88.8 88.8 90.5  PLT  279 294 281   Cardiac Enzymes: No results for input(s): CKTOTAL, CKMB, CKMBINDEX, TROPONINI in the last 168 hours. BNP: Invalid input(s): POCBNP CBG: Recent Labs  Lab 06/11/19 1117 06/11/19 1611 06/11/19 2202 06/12/19 0601 06/12/19 1107  GLUCAP 111* 86 108* 96 83   D-Dimer No results for input(s): DDIMER in the last 72 hours. Hgb A1c  No results for input(s): HGBA1C in the last 72 hours. Lipid Profile No results for input(s): CHOL, HDL, LDLCALC, TRIG, CHOLHDL, LDLDIRECT in the last 72 hours. Thyroid function studies No results for input(s): TSH, T4TOTAL, T3FREE, THYROIDAB in the last 72 hours.  Invalid input(s): FREET3 Anemia work up No results for input(s): VITAMINB12, FOLATE, FERRITIN, TIBC, IRON, RETICCTPCT in the last 72 hours. Microbiology Recent Results (from the past 240 hour(s))  SARS CORONAVIRUS 2 (TAT 6-12 HRS) Nasal Swab Aptima Multi Swab     Status: None   Collection Time: 06/11/19  1:09 AM   Specimen: Aptima Multi Swab; Nasal Swab  Result Value Ref Range Status   SARS Coronavirus 2 NEGATIVE NEGATIVE Final    Comment: (NOTE) SARS-CoV-2 target nucleic acids are NOT DETECTED. The SARS-CoV-2 RNA is generally detectable in upper and lower respiratory specimens during the acute phase of infection. Negative results do not preclude SARS-CoV-2 infection, do not rule out co-infections with other pathogens, and should not be used as the sole basis for treatment or other patient management decisions. Negative results must be combined with clinical observations, patient history, and epidemiological information. The expected result is Negative. Fact Sheet for Patients: SugarRoll.be Fact Sheet for Healthcare Providers: https://www.woods-mathews.com/ This test is not yet approved or cleared by the [place] FDA and  has been authorized for detection and/or diagnosis of SARS-CoV-2 by FDA under an Emergency Use Authorization (EUA). This EUA will remain  in effect (meaning this test can be used) for the duration of the COVID-19 declaration under Section 56 4(b)(1) of the Act, 21 U.S.C. section 360bbb-3(b)(1), unless the authorization is terminated or revoked sooner. Performed at [facility], [place] [address] [place], [place] [address]      Discharge  Instructions:   Discharge Instructions    Diet - low sodium heart healthy   Complete by: As directed    Discharge instructions   Complete by: As directed    Avoid alcohol ER sent carafate to the pharmacy -- can hold on taking that unless having increased pain with eating (this "coats" your stomach to protect the lining) If continues after lifestyle changes, may need GI follow and EGD   Increase activity slowly   Complete by: As directed      Allergies as of 06/12/2019      Reactions   Codeine Nausea And Vomiting   Other Nausea And Vomiting   Any narcotic      Medication List    STOP taking these medications   ibuprofen 800 MG tablet Commonly known as: ADVIL     TAKE these medications   aspirin 81 MG EC tablet Take 1 tablet (81 mg total) by mouth daily. What changed: when to take this   co-enzyme Q-10 30 MG capsule Take 30 mg by mouth daily.   diclofenac sodium 1 % Gel Commonly known as: VOLTAREN Apply 2 g topically 4 (four) times daily.   esomeprazole 40 MG capsule Commonly known as: NEXIUM Take 1 capsule (40 mg total) by mouth daily.   lidocaine 5 % Commonly known as: Lidoderm Place 1 patch onto the skin daily. Remove &  Discard patch within 12 hours or as directed by MD   ondansetron 4 MG disintegrating tablet Commonly known as: Zofran ODT Take 1 tablet (4 mg total) by mouth every 8 (eight) hours as needed for nausea or vomiting.   oxyCODONE-acetaminophen 5-325 MG tablet Commonly known as: PERCOCET/ROXICET Take 1 tablet by mouth every 6 (six) hours as needed for severe pain.   rosuvastatin 5 MG tablet Commonly known as: Crestor Take 1 tablet (5 mg total) by mouth daily. What changed: when to take this   sucralfate 1 g tablet Commonly known as: Carafate Take 1 tablet (1 g total) by mouth 4 (four) times daily -  with meals and at bedtime.      Follow-up Information    [facility].   Specialty: Emergency Medicine  Why: Return as needed for new or worsening symptoms Contact information: [address] [id number] [place] [id number] [phone]       Elby Showers, MD.   Specialty: Internal Medicine Contact information: [address] [place] [id number] [facility].   Specialty: Gastroenterology Contact information: [address] [place] [phone]           Time coordinating discharge: 35 min  Signed:  Geradine Girt DO  [facility] 06/12/2019, 12:48 PM

## 2019-06-16 DIAGNOSIS — M545 Low back pain: Secondary | ICD-10-CM | POA: Diagnosis not present

## 2019-06-16 DIAGNOSIS — M5136 Other intervertebral disc degeneration, lumbar region: Secondary | ICD-10-CM | POA: Diagnosis not present

## 2019-06-19 ENCOUNTER — Other Ambulatory Visit: Payer: Self-pay

## 2019-06-19 ENCOUNTER — Ambulatory Visit (INDEPENDENT_AMBULATORY_CARE_PROVIDER_SITE_OTHER): Payer: Medicare HMO | Admitting: Internal Medicine

## 2019-06-19 ENCOUNTER — Encounter: Payer: Self-pay | Admitting: Internal Medicine

## 2019-06-19 VITALS — BP 130/70 | HR 61 | Temp 98.3°F | Ht 69.0 in | Wt 210.0 lb

## 2019-06-19 DIAGNOSIS — K852 Alcohol induced acute pancreatitis without necrosis or infection: Secondary | ICD-10-CM

## 2019-06-19 DIAGNOSIS — K7010 Alcoholic hepatitis without ascites: Secondary | ICD-10-CM | POA: Diagnosis not present

## 2019-06-19 DIAGNOSIS — K2900 Acute gastritis without bleeding: Secondary | ICD-10-CM

## 2019-06-19 DIAGNOSIS — R69 Illness, unspecified: Secondary | ICD-10-CM | POA: Diagnosis not present

## 2019-06-19 LAB — CBC WITH DIFFERENTIAL/PLATELET
Absolute Monocytes: 546 cells/uL (ref 200–950)
Basophils Absolute: 62 cells/uL (ref 0–200)
Basophils Relative: 1.2 %
Eosinophils Absolute: 182 cells/uL (ref 15–500)
Eosinophils Relative: 3.5 %
HCT: 43 % (ref 38.5–50.0)
Hemoglobin: 14.7 g/dL (ref 13.2–17.1)
Lymphs Abs: 1950 cells/uL (ref 850–3900)
MCH: 30.8 pg (ref 27.0–33.0)
MCHC: 34.2 g/dL (ref 32.0–36.0)
MCV: 90 fL (ref 80.0–100.0)
MPV: 9.8 fL (ref 7.5–12.5)
Monocytes Relative: 10.5 %
Neutro Abs: 2460 cells/uL (ref 1500–7800)
Neutrophils Relative %: 47.3 %
Platelets: 309 10*3/uL (ref 140–400)
RBC: 4.78 10*6/uL (ref 4.20–5.80)
RDW: 12.2 % (ref 11.0–15.0)
Total Lymphocyte: 37.5 %
WBC: 5.2 10*3/uL (ref 3.8–10.8)

## 2019-06-26 DIAGNOSIS — M5136 Other intervertebral disc degeneration, lumbar region: Secondary | ICD-10-CM | POA: Diagnosis not present

## 2019-06-26 DIAGNOSIS — M48061 Spinal stenosis, lumbar region without neurogenic claudication: Secondary | ICD-10-CM | POA: Diagnosis not present

## 2019-07-02 DIAGNOSIS — M48061 Spinal stenosis, lumbar region without neurogenic claudication: Secondary | ICD-10-CM | POA: Diagnosis not present

## 2019-07-02 DIAGNOSIS — M5416 Radiculopathy, lumbar region: Secondary | ICD-10-CM | POA: Diagnosis not present

## 2019-07-02 DIAGNOSIS — M5126 Other intervertebral disc displacement, lumbar region: Secondary | ICD-10-CM | POA: Diagnosis not present

## 2019-07-02 DIAGNOSIS — M545 Low back pain: Secondary | ICD-10-CM | POA: Diagnosis not present

## 2019-07-02 DIAGNOSIS — R030 Elevated blood-pressure reading, without diagnosis of hypertension: Secondary | ICD-10-CM | POA: Diagnosis not present

## 2019-07-02 DIAGNOSIS — Z6832 Body mass index (BMI) 32.0-32.9, adult: Secondary | ICD-10-CM | POA: Diagnosis not present

## 2019-07-09 NOTE — Progress Notes (Signed)
   Subjective:    Patient ID: Jacob Le, male    DOB: 15-Mar-1951, 68 y.o.   MRN: [id number]  HPI 68 year old Male in today for hospitalization follow-up.  Was admitted to hospital August 25 through August 27 with intractable vomiting and epigastric pain that was rather severe that started the day prior to admission.  It radiated to his back.  His lipase was elevated at 68 normal being up to 51.  Hospitalist thought he had gastritis versus pancreatitis.  His CT angios was negative for PE.  CT abdomen and pelvis showed no dissection.  Liver was normal.  Pancreas was normal.  Patient status post cholecystectomy.  Small bilateral inguinal hernias noted.  Ultrasound of right upper quadrant showed findings consistent with hepatic steatosis and prior cholecystectomy.  He was treated conservatively and was discharged home on August 27.  Was told to watch alcohol consumption.  SGOT was elevated at 200 with an SGPT of 191.  Total bilirubin was 1.7 and indirect bilirubin 1.5.  He was admitted in December 17, 2017 with chest pain.  Was found to have Mobitz 1 second-degree AV block.  Was taken to the Cath Lab due to diffuse inferior ST elevation.  Was found to have moderate nonobstructive disease in right coronary artery, circumflex and LAD.  Normal left ventricular end-diastolic pressure and normal left ventricular ejection fraction.  Was diagnosed with acute pericarditis and is followed by cardiology.  History of sleep apnea.  Subsequently was readmitted March 28 and was felt to have pancreatitis but he had a normal lipase.  Apparently had remote history of pancreatitis when he was living in another tail several years ago.  He is status post cholecystectomy.  Apparently eating and drinking too much recently.       Review of Systems no new complaints and feels much better     Objective:   Physical Exam Blood pressure 130/70, pulse 61, temperature 98.3 degrees orally weight 210 pounds.  Abdomen soft  nondistended without hepatosplenomegaly masses or tenderness.  He looks well and in no acute distress.       Assessment & Plan:  Mild pancreatitis  Alcoholic hepatitis  Plan: He is to return and have repeat liver functions late September.  He is to watch alcohol consumption.  CBC checked today is within normal limits.

## 2019-07-12 NOTE — Patient Instructions (Addendum)
Return in late September for repeat liver functions.  Avoid alcohol and stay well-hydrated.

## 2019-07-14 ENCOUNTER — Other Ambulatory Visit: Payer: Self-pay

## 2019-07-14 DIAGNOSIS — Z20822 Contact with and (suspected) exposure to covid-19: Secondary | ICD-10-CM

## 2019-07-15 LAB — NOVEL CORONAVIRUS, NAA: SARS-CoV-2, NAA: NOT DETECTED

## 2019-07-17 ENCOUNTER — Other Ambulatory Visit: Payer: Self-pay

## 2019-07-17 ENCOUNTER — Other Ambulatory Visit: Payer: Medicare HMO | Admitting: Internal Medicine

## 2019-07-17 DIAGNOSIS — R7989 Other specified abnormal findings of blood chemistry: Secondary | ICD-10-CM

## 2019-07-18 LAB — HEPATIC FUNCTION PANEL
AG Ratio: 2 (calc) (ref 1.0–2.5)
ALT: 34 U/L (ref 9–46)
AST: 21 U/L (ref 10–35)
Albumin: 4.4 g/dL (ref 3.6–5.1)
Alkaline phosphatase (APISO): 75 U/L (ref 35–144)
Bilirubin, Direct: 0.3 mg/dL — ABNORMAL HIGH (ref 0.0–0.2)
Globulin: 2.2 g/dL (calc) (ref 1.9–3.7)
Indirect Bilirubin: 1.2 mg/dL (calc) (ref 0.2–1.2)
Total Bilirubin: 1.5 mg/dL — ABNORMAL HIGH (ref 0.2–1.2)
Total Protein: 6.6 g/dL (ref 6.1–8.1)

## 2019-07-21 ENCOUNTER — Telehealth: Payer: Self-pay | Admitting: Internal Medicine

## 2019-07-21 NOTE — Telephone Encounter (Signed)
Called to give lab results, Patient verbalized understanding

## 2019-07-21 NOTE — Telephone Encounter (Signed)
Jacob Le returned call to say that he had only water during 12 hours prior to labs.

## 2019-07-21 NOTE — Telephone Encounter (Signed)
His liver functions have improved and bilirubin near normal.

## 2019-08-04 DIAGNOSIS — M48061 Spinal stenosis, lumbar region without neurogenic claudication: Secondary | ICD-10-CM | POA: Diagnosis not present

## 2019-08-04 DIAGNOSIS — R030 Elevated blood-pressure reading, without diagnosis of hypertension: Secondary | ICD-10-CM | POA: Diagnosis not present

## 2019-08-04 DIAGNOSIS — M545 Low back pain: Secondary | ICD-10-CM | POA: Diagnosis not present

## 2019-08-04 DIAGNOSIS — Z6832 Body mass index (BMI) 32.0-32.9, adult: Secondary | ICD-10-CM | POA: Diagnosis not present

## 2019-08-04 DIAGNOSIS — M5416 Radiculopathy, lumbar region: Secondary | ICD-10-CM | POA: Diagnosis not present

## 2019-08-04 DIAGNOSIS — M5126 Other intervertebral disc displacement, lumbar region: Secondary | ICD-10-CM | POA: Diagnosis not present

## 2019-08-26 IMAGING — DX DG CHEST 1V PORT
1 series · 1 of 1 positions shown · non-contrast
Comparison: None.

CLINICAL DATA: 67 y/o  M: chest pain, code STEMI

EXAM:
PORTABLE CHEST 1 VIEW

[chest]
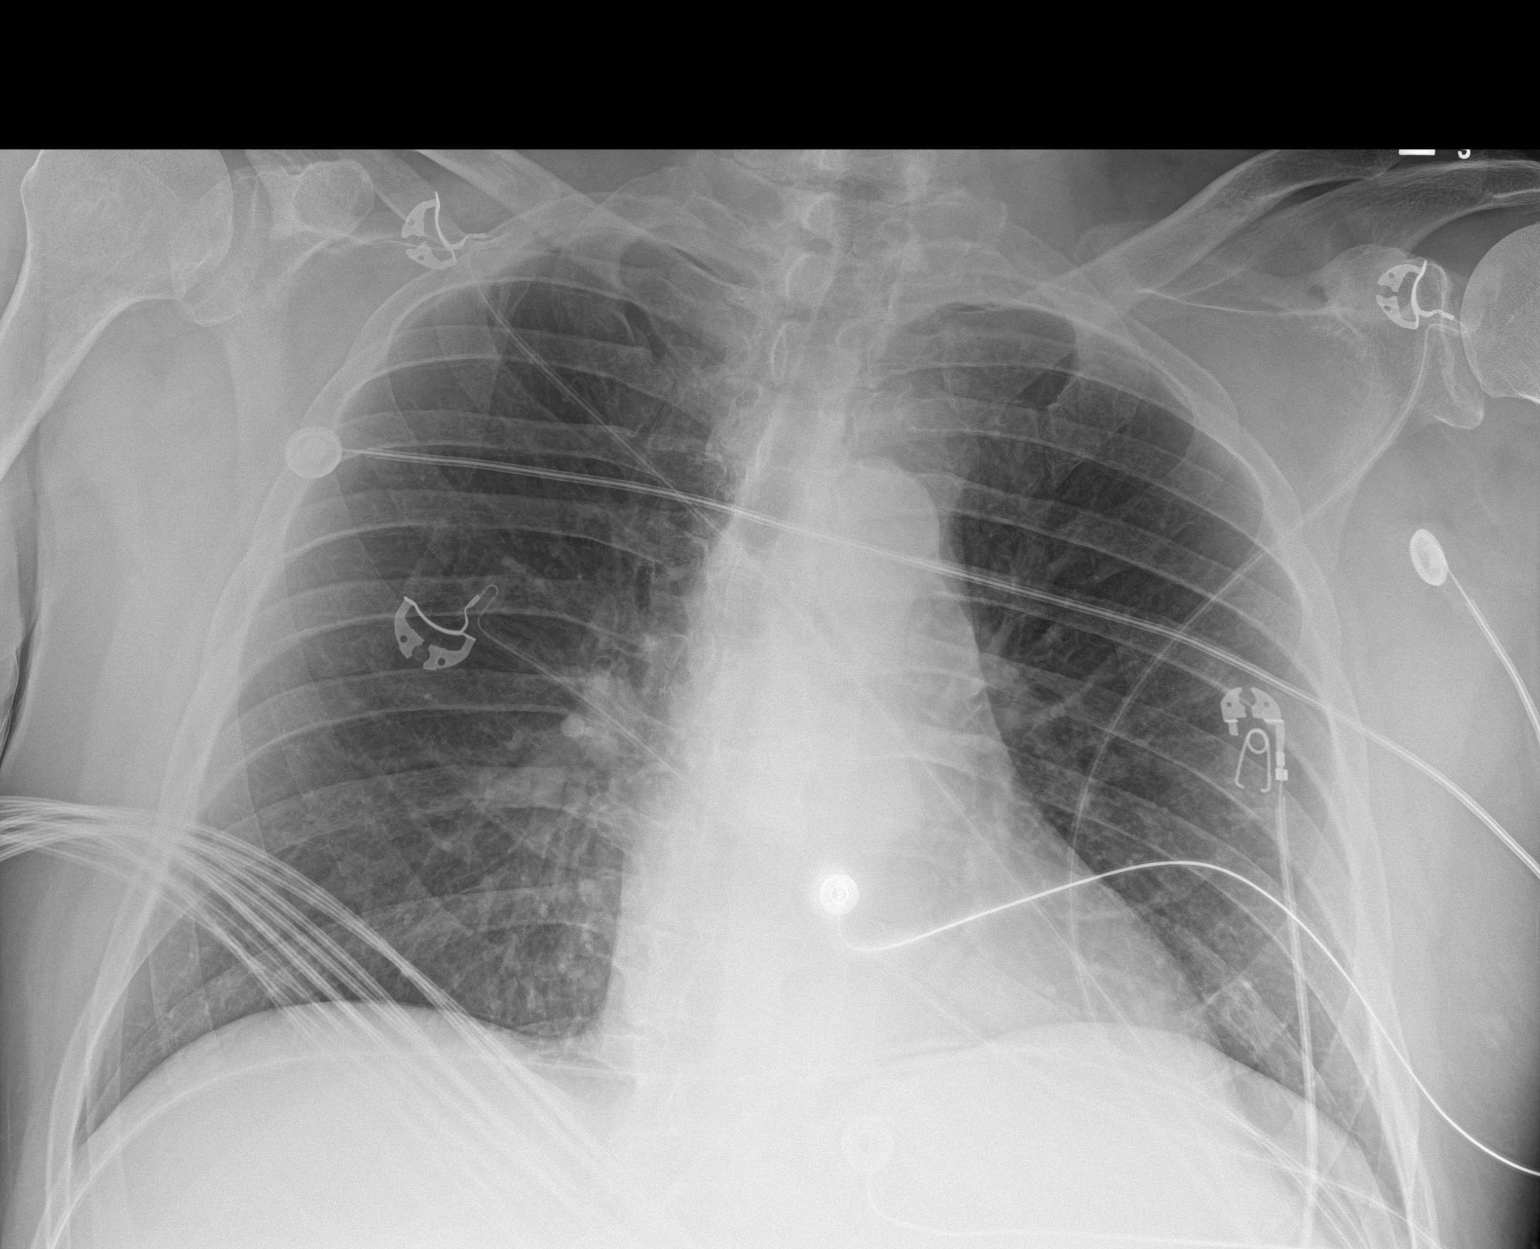

[1 of 1 positions shown; findings below may reference images not displayed]

FINDINGS: The heart size and mediastinal contours are within normal limits.
Both lungs are clear. The visualized skeletal structures are
unremarkable.
IMPRESSION: No active disease.

## 2019-09-03 DIAGNOSIS — Z87891 Personal history of nicotine dependence: Secondary | ICD-10-CM | POA: Diagnosis not present

## 2019-09-03 DIAGNOSIS — L821 Other seborrheic keratosis: Secondary | ICD-10-CM | POA: Diagnosis not present

## 2019-09-03 DIAGNOSIS — L820 Inflamed seborrheic keratosis: Secondary | ICD-10-CM | POA: Diagnosis not present

## 2019-09-03 DIAGNOSIS — D1801 Hemangioma of skin and subcutaneous tissue: Secondary | ICD-10-CM | POA: Diagnosis not present

## 2019-09-03 DIAGNOSIS — L814 Other melanin hyperpigmentation: Secondary | ICD-10-CM | POA: Diagnosis not present

## 2019-09-03 DIAGNOSIS — Z85828 Personal history of other malignant neoplasm of skin: Secondary | ICD-10-CM | POA: Diagnosis not present

## 2019-09-16 ENCOUNTER — Other Ambulatory Visit: Payer: Self-pay

## 2019-09-16 ENCOUNTER — Ambulatory Visit: Payer: Medicare HMO | Admitting: Physician Assistant

## 2019-09-16 ENCOUNTER — Encounter: Payer: Self-pay | Admitting: Physician Assistant

## 2019-09-16 VITALS — BP 128/70 | HR 57 | Ht 68.0 in | Wt 216.0 lb

## 2019-09-16 DIAGNOSIS — E785 Hyperlipidemia, unspecified: Secondary | ICD-10-CM | POA: Diagnosis not present

## 2019-09-16 DIAGNOSIS — I2510 Atherosclerotic heart disease of native coronary artery without angina pectoris: Secondary | ICD-10-CM

## 2019-09-16 DIAGNOSIS — I441 Atrioventricular block, second degree: Secondary | ICD-10-CM | POA: Diagnosis not present

## 2019-09-16 DIAGNOSIS — R030 Elevated blood-pressure reading, without diagnosis of hypertension: Secondary | ICD-10-CM

## 2019-09-16 DIAGNOSIS — Z8679 Personal history of other diseases of the circulatory system: Secondary | ICD-10-CM | POA: Diagnosis not present

## 2019-09-16 NOTE — Progress Notes (Signed)
Cardiology Office Note:    Date:  09/16/2019   ID:  [name], DOB 03-15-1951, MRN [id number]  PCP:  Elby Showers, MD  Cardiologist:  Lauree Chandler, MD   Electrophysiologist:  None   Referring MD: Elby Showers, MD   Chief Complaint  Patient presents with  . Follow-up    CAD     History of Present Illness:    Jacob Le is a 68 y.o. 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 Angelena Form in 08/2018.  He returns for follow up.  He is here alone.  He has done well without chest pain, shortness of breath, syncope, leg swelling.  He remains active and rides bikes, plays pickle ball.  He had a recent herniated disc and also has spinal stenosis.  He has had worsening back pain recently and decided to hold his Rosuvastatin to see if it was making it worse.  He held it 4 days ago and it does feel better.  He has noted some elevated BP readings lately. It has always been on the low side.  His BP today is actually high for him.     Prior CV studies:   The following studies were reviewed today:  Echocardiogram 12/18/2017 EF 60-65, normal wall motion, grade 2 diastolic dysfunction, mild AI, mild to moderate MR, moderate LAE, mild RAE, PASP 34  Cardiac catheterization 12/18/2017 LAD proximal 40, mid 40 LCx ostial 30, proximal 20 RCA proximal 40, mid 20 EF 55-65, LVEDP 15  24-hour Holter 11/2016 1. NSR with sinus bradycardia 2. Periods of AV WB and nocturnal 2:1 block 3. Rare PVC's 4. No prolonged pauses  Past Medical History:  Diagnosis Date  . Dysrhythmia    2nd degree heart block, Type 1  . Sleep apnea    Surgical Hx: The patient  has a past surgical history that includes pancreatis attack; torn right rotator cuff; broken left shoulder; LEFT HEART CATH AND CORONARY ANGIOGRAPHY (N/A,  12/18/2017); pericarditis; and Cholecystectomy.   Current Medications: Current Meds  Medication Sig  . aspirin EC 81 MG EC tablet Take 1 tablet (81 mg total) by mouth daily. (Patient taking differently: Take 81 mg by mouth every evening. )  . co-enzyme Q-10 30 MG capsule Take 30 mg by mouth daily.  Marland Kitchen esomeprazole (NEXIUM) 40 MG capsule Take 1 capsule (40 mg total) by mouth daily.  . ondansetron (ZOFRAN ODT) 4 MG disintegrating tablet Take 1 tablet (4 mg total) by mouth every 8 (eight) hours as needed for nausea or vomiting.  . rosuvastatin (CRESTOR) 5 MG tablet Take 1 tablet (5 mg total) by mouth daily. (Patient taking differently: Take 5 mg by mouth every evening. )  . [DISCONTINUED] diclofenac sodium (VOLTAREN) 1 % GEL Apply 2 g topically 4 (four) times daily.  . [DISCONTINUED] lidocaine (LIDODERM) 5 % Place 1 patch onto the skin daily. Remove & Discard patch within 12 hours or as directed by MD  . [DISCONTINUED] oxyCODONE-acetaminophen (PERCOCET/ROXICET) 5-325 MG tablet Take 1 tablet by mouth every 6 (six) hours as needed for severe pain.  . [DISCONTINUED] sucralfate (CARAFATE) 1 g tablet Take 1 tablet (1 g total) by mouth 4 (four) times daily -  with meals and at bedtime.     Allergies:   Codeine  and Other   Social History   Tobacco Use  . Smoking status: Never Smoker  . Smokeless tobacco: Never Used  Substance Use Topics  . Alcohol use: Yes    Comment: 4oz weekly   . Drug use: No     Family Hx: The patient's family history includes Cancer in his mother.  ROS:   Please see the history of present illness.    ROS All other systems reviewed and are negative.   EKGs/Labs/Other Test Reviewed:    EKG:  EKG is not ordered today.  The ekg done on 06/10/2019 was personally reviewed and demonstrated normal sinus rhythm, HR 94, 2nd degree type 1, no ST changes, QTc 437   Recent Labs: 06/12/2019: BUN 10; Creatinine, Ser 1.10; Potassium 4.1; Sodium 139 06/19/2019: Hemoglobin 14.7;  Platelets 309 07/17/2019: ALT 34   Recent Lipid Panel Lab Results  Component Value Date/Time   CHOL 182 05/16/2019 09:08 AM   CHOL 138 04/10/2018 12:17 PM   TRIG 84 05/16/2019 09:08 AM   HDL 55 05/16/2019 09:08 AM   HDL 59 04/10/2018 12:17 PM   CHOLHDL 3.3 05/16/2019 09:08 AM   LDLCALC 110 (H) 05/16/2019 09:08 AM    Physical Exam:    VS:  BP 128/70   Pulse (!) 57   Ht 5\' 8"  (1.727 m)   Wt 216 lb (98 kg)   SpO2 98%   BMI 32.84 kg/m     Wt Readings from Last 3 Encounters:  09/16/19 216 lb (98 kg)  06/19/19 210 lb (95.3 kg)  06/12/19 217 lb (98.4 kg)     Physical Exam  Constitutional: He is oriented to person, place, and time. He appears well-developed and well-nourished. No distress.  HENT:  Head: Normocephalic and atraumatic.  Eyes: No scleral icterus.  Neck: Neck supple. No JVD present. No thyromegaly present.  Cardiovascular: Normal rate, regular rhythm, S1 normal and S2 normal.  No murmur heard. Pulmonary/Chest: Breath sounds normal. He has no rales.  Abdominal: Soft. There is no hepatomegaly.  Musculoskeletal:        General: No edema.  Lymphadenopathy:    He has no cervical adenopathy.  Neurological: He is alert and oriented to person, place, and time.  Skin: Skin is warm and dry.  Psychiatric: He has a normal mood and affect.    ASSESSMENT & PLAN:    1. Coronary artery disease involving native coronary artery of native heart without angina pectoris He had mod non-obstructive CAD at cath in 2019.  He has not had anginal symptoms.  Continue ASA.  Hold statin as noted below.  2. Hyperlipidemia, unspecified hyperlipidemia type He has had some back pain.  This may be related to his primary back issue or possibly myalgias from statin Rx.  Hold Rosuvastatin x 1 month.  If his pain clearly returns with resuming Rosuvastatin, he should call.  At that point, I would change him to Ezetimibe.    3. Second degree AV block, Mobitz type I Asymptomatic.  He is not on  beta-blocker therapy.    4. History of pericarditis Resolved.  No apparent recurrence.    5. Blood pressure elevated without history of HTN BP today is at goal.  He has seen some higher readings.  I have given him a BP cuff today.  I have asked him to check his BP several times a week for 2 weeks and send those to me for review.  We discussed lifestyle modifications including limiting salt and weight loss.  Dispo:  Return in about 1 year (around 09/15/2020) for Routine Follow Up w/ Dr. Angelena Form, in person.   Medication Adjustments/Labs and Tests Ordered: Current medicines are reviewed at length with the patient today.  Concerns regarding medicines are outlined above.  Tests Ordered: No orders of the defined types were placed in this encounter.  Medication Changes: No orders of the defined types were placed in this encounter.   Signed, Richardson Dopp, PA-C  09/16/2019 [facility], [place], [place]  [address] Phone: [phone]; Fax: [phone]

## 2019-09-16 NOTE — Patient Instructions (Signed)
Medication Instructions:  Hold Crestor for 1 month to see if your back pains goes away, If it does not please call our office and let us know   *If you need a refill on your cardiac medications before your next appointment, please call your pharmacy*  Lab Work: None   If you have labs (blood work) drawn today and your tests are completely normal, you will receive your results only by: Marland Kitchen MyChart Message (if you have MyChart) OR . A paper copy in the mail If you have any lab test that is abnormal or we need to change your treatment, we will call you to review the results.  Testing/Procedures: None   Follow-Up: At [facility], you and your health needs are our priority.  As part of our continuing mission to provide you with exceptional heart care, we have created designated Provider Care Teams.  These Care Teams include your primary Cardiologist (physician) and Advanced Practice Providers (APPs -  Physician Assistants and Nurse Practitioners) who all work together to provide you with the care you need, when you need it.  Your next appointment:   12 month(s)  The format for your next appointment:   Either in person or virtual   Provider:   You may see Lauree Chandler, MD or one of the following Advanced Practice Providers on your designated Care Team:    Melina Copa, PA-C  Ermalinda Barrios, PA-C   Other Instructions Check your blood pressure several times a week for 2 weeks then send [place] the readings

## 2019-09-26 DIAGNOSIS — M546 Pain in thoracic spine: Secondary | ICD-10-CM | POA: Diagnosis not present

## 2019-10-06 DIAGNOSIS — M546 Pain in thoracic spine: Secondary | ICD-10-CM | POA: Diagnosis not present

## 2019-10-06 DIAGNOSIS — M6281 Muscle weakness (generalized): Secondary | ICD-10-CM | POA: Diagnosis not present

## 2019-10-15 DIAGNOSIS — M546 Pain in thoracic spine: Secondary | ICD-10-CM | POA: Diagnosis not present

## 2019-10-15 DIAGNOSIS — M6281 Muscle weakness (generalized): Secondary | ICD-10-CM | POA: Diagnosis not present

## 2019-10-20 DIAGNOSIS — M546 Pain in thoracic spine: Secondary | ICD-10-CM | POA: Diagnosis not present

## 2019-10-20 DIAGNOSIS — M6281 Muscle weakness (generalized): Secondary | ICD-10-CM | POA: Diagnosis not present

## 2019-10-22 DIAGNOSIS — M6281 Muscle weakness (generalized): Secondary | ICD-10-CM | POA: Diagnosis not present

## 2019-10-22 DIAGNOSIS — M546 Pain in thoracic spine: Secondary | ICD-10-CM | POA: Diagnosis not present

## 2019-10-27 DIAGNOSIS — M6281 Muscle weakness (generalized): Secondary | ICD-10-CM | POA: Diagnosis not present

## 2019-10-27 DIAGNOSIS — M546 Pain in thoracic spine: Secondary | ICD-10-CM | POA: Diagnosis not present

## 2019-10-29 DIAGNOSIS — M6281 Muscle weakness (generalized): Secondary | ICD-10-CM | POA: Diagnosis not present

## 2019-10-29 DIAGNOSIS — M546 Pain in thoracic spine: Secondary | ICD-10-CM | POA: Diagnosis not present

## 2019-11-03 ENCOUNTER — Ambulatory Visit: Payer: Medicare HMO | Attending: Internal Medicine

## 2019-11-03 DIAGNOSIS — Z20822 Contact with and (suspected) exposure to covid-19: Secondary | ICD-10-CM | POA: Diagnosis not present

## 2019-11-04 LAB — NOVEL CORONAVIRUS, NAA: SARS-CoV-2, NAA: NOT DETECTED

## 2019-11-11 ENCOUNTER — Ambulatory Visit: Payer: Medicare HMO

## 2019-11-19 DIAGNOSIS — R69 Illness, unspecified: Secondary | ICD-10-CM | POA: Diagnosis not present

## 2019-11-20 ENCOUNTER — Ambulatory Visit: Payer: Medicare HMO

## 2019-12-02 ENCOUNTER — Ambulatory Visit: Payer: Medicare HMO

## 2020-02-13 ENCOUNTER — Other Ambulatory Visit: Payer: Self-pay

## 2020-02-13 ENCOUNTER — Telehealth (INDEPENDENT_AMBULATORY_CARE_PROVIDER_SITE_OTHER): Payer: Medicare HMO | Admitting: Internal Medicine

## 2020-02-13 ENCOUNTER — Encounter: Payer: Self-pay | Admitting: Internal Medicine

## 2020-02-13 ENCOUNTER — Telehealth: Payer: Self-pay

## 2020-02-13 VITALS — HR 52 | Temp 98.7°F | Ht 68.0 in | Wt 216.0 lb

## 2020-02-13 DIAGNOSIS — J22 Unspecified acute lower respiratory infection: Secondary | ICD-10-CM | POA: Diagnosis not present

## 2020-02-13 MED ORDER — DOXYCYCLINE HYCLATE 100 MG PO TABS: 100.0000 mg | ORAL_TABLET | Freq: Two times a day (BID) | ORAL | 0 refills | Status: DC

## 2020-02-13 MED ORDER — ALBUTEROL SULFATE HFA 108 (90 BASE) MCG/ACT IN AERS: 2.0000 | INHALATION_SPRAY | Freq: Four times a day (QID) | RESPIRATORY_TRACT | 1 refills | Status: DC | PRN

## 2020-02-13 NOTE — Telephone Encounter (Signed)
Virtual Visit scheduled °

## 2020-02-13 NOTE — Telephone Encounter (Signed)
Please call and advise them he will needs a virtual visit. We do not call in antibiotics without doctor evaluation.

## 2020-02-13 NOTE — Telephone Encounter (Signed)
Patient's wife called she wants some antibiotics and inhalers called in for her husband he has a cough, chest tightness, achy and temperature was 98.6 this started on Wednesday has had both moderna shots last was 12/11/19  Call back number [phone]

## 2020-02-13 NOTE — Patient Instructions (Signed)
Rest and drink plenty of fluids.  Use albuterol inhaler 2 sprays up to 4 times daily as needed for cough and wheezing.  Doxycycline 100 mg twice daily for 10 days.

## 2020-02-13 NOTE — Progress Notes (Addendum)
   Subjective:    Patient ID: Jacob Le, male    DOB: Mar 30, 1951, 69 y.o.   MRN: [id number]  HPI Pleasant 69 year old Male with history of sleep apnea treated with CPAP seen today for respiratory infection symptoms.  Wife recently went to [employer] with grandchildren and came down with respiratory infection.  She had respiratory congestion and wheezing.  We prescribed antibiotic and inhaler for her.  Husband has now come down with similar illness.  He has respiratory congestion and "rattling" in his chest.  Does have some discolored sputum production.  Has felt fatigued.  No fever or shaking chills.  Wife tested negative for COVID-19 recently.  They both have been vaccinated and have received both vaccines.  He also has had both Prevnar 13 and pneumococcal 23 vaccines.  Due to the Coronavirus pandemic, he is seen by interactive audio [employer] video [employer].  He is identified using 2 identifiers as Jacob Le, a patient in this practice.  He is agreeable to visit in this format today.  He has history of hyperlipidemia treated with Crestor.  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  History of pancreatitis due to gallstones.  Physically active.  Review of Systems has fatigue and respiratory congestion.  No nausea or vomiting.  No shaking chills.     Objective:   Physical Exam Temperature 98.7 degrees pulse 52 weight 216 pounds pulse oximetry 97%  He is seen virtually today.  He sounds a bit congested in his chest when he speaks.  He is not tachypneic or in acute distress.       Assessment & Plan:  Acute lower respiratory infection-wife has had similar illness which started after a trip to [employer].  Patient seems to have contracted similar viral respiratory illness from his wife.  Plan: He has had both COVID-19 immunizations, the last one was February 25.  We are going to prescribe an inhaler for him- Albuterol 2 sprays 4 times daily as  needed for respiratory congestion and doxycycline 100 mg twice daily for 10 days.  Rest and drink plenty of fluids.  Call if not improving next week or sooner if worse.

## 2020-04-13 ENCOUNTER — Telehealth: Payer: Self-pay | Admitting: Internal Medicine

## 2020-04-13 NOTE — Telephone Encounter (Signed)
LVM to CB and schedule CPE,AMW and labs prior visits due 03/25/20

## 2020-05-07 DIAGNOSIS — Z20822 Contact with and (suspected) exposure to covid-19: Secondary | ICD-10-CM | POA: Diagnosis not present

## 2020-05-07 DIAGNOSIS — Z03818 Encounter for observation for suspected exposure to other biological agents ruled out: Secondary | ICD-10-CM | POA: Diagnosis not present

## 2020-05-20 DIAGNOSIS — R69 Illness, unspecified: Secondary | ICD-10-CM | POA: Diagnosis not present

## 2020-05-25 ENCOUNTER — Other Ambulatory Visit: Payer: Self-pay

## 2020-05-25 ENCOUNTER — Other Ambulatory Visit: Payer: Medicare HMO | Admitting: Internal Medicine

## 2020-05-25 DIAGNOSIS — Z0000 Encounter for general adult medical examination without abnormal findings: Secondary | ICD-10-CM | POA: Diagnosis not present

## 2020-05-25 DIAGNOSIS — E7800 Pure hypercholesterolemia, unspecified: Secondary | ICD-10-CM | POA: Diagnosis not present

## 2020-05-25 DIAGNOSIS — Z125 Encounter for screening for malignant neoplasm of prostate: Secondary | ICD-10-CM

## 2020-05-26 LAB — CBC WITH DIFFERENTIAL/PLATELET
Absolute Monocytes: 504 cells/uL (ref 200–950)
Basophils Absolute: 50 cells/uL (ref 0–200)
Basophils Relative: 0.9 %
Eosinophils Absolute: 291 cells/uL (ref 15–500)
Eosinophils Relative: 5.2 %
HCT: 46.2 % (ref 38.5–50.0)
Hemoglobin: 15.7 g/dL (ref 13.2–17.1)
Lymphs Abs: 2458 cells/uL (ref 850–3900)
MCH: 30.8 pg (ref 27.0–33.0)
MCHC: 34 g/dL (ref 32.0–36.0)
MCV: 90.6 fL (ref 80.0–100.0)
MPV: 10.2 fL (ref 7.5–12.5)
Monocytes Relative: 9 %
Neutro Abs: 2296 cells/uL (ref 1500–7800)
Neutrophils Relative %: 41 %
Platelets: 264 10*3/uL (ref 140–400)
RBC: 5.1 10*6/uL (ref 4.20–5.80)
RDW: 13 % (ref 11.0–15.0)
Total Lymphocyte: 43.9 %
WBC: 5.6 10*3/uL (ref 3.8–10.8)

## 2020-05-26 LAB — COMPLETE METABOLIC PANEL WITH GFR
AG Ratio: 2 (calc) (ref 1.0–2.5)
ALT: 31 U/L (ref 9–46)
AST: 23 U/L (ref 10–35)
Albumin: 4.6 g/dL (ref 3.6–5.1)
Alkaline phosphatase (APISO): 70 U/L (ref 35–144)
BUN/Creatinine Ratio: 19 (calc) (ref 6–22)
BUN: 27 mg/dL — ABNORMAL HIGH (ref 7–25)
CO2: 22 mmol/L (ref 20–32)
Calcium: 10 mg/dL (ref 8.6–10.3)
Chloride: 108 mmol/L (ref 98–110)
Creat: 1.39 mg/dL — ABNORMAL HIGH (ref 0.70–1.18)
GFR, Est African American: 59 mL/min/{1.73_m2} — ABNORMAL LOW (ref >=60)
GFR, Est Non African American: 51 mL/min/{1.73_m2} — ABNORMAL LOW (ref >=60)
Globulin: 2.3 g/dL (calc) (ref 1.9–3.7)
Glucose, Bld: 106 mg/dL — ABNORMAL HIGH (ref 65–99)
Potassium: 5.2 mmol/L (ref 3.5–5.3)
Sodium: 139 mmol/L (ref 135–146)
Total Bilirubin: 1.3 mg/dL — ABNORMAL HIGH (ref 0.2–1.2)
Total Protein: 6.9 g/dL (ref 6.1–8.1)

## 2020-05-26 LAB — LIPID PANEL
Cholesterol: 140 mg/dL (ref <200)
HDL: 49 mg/dL (ref >=40)
LDL Cholesterol (Calc): 73 mg/dL (calc)
Non-HDL Cholesterol (Calc): 91 mg/dL (calc) (ref <130)
Total CHOL/HDL Ratio: 2.9 (calc) (ref <5.0)
Triglycerides: 95 mg/dL (ref <150)

## 2020-05-26 LAB — PSA: PSA: 2.6 ng/mL (ref <=4.0)

## 2020-05-30 ENCOUNTER — Other Ambulatory Visit: Payer: Self-pay | Admitting: Internal Medicine

## 2020-05-31 ENCOUNTER — Ambulatory Visit (INDEPENDENT_AMBULATORY_CARE_PROVIDER_SITE_OTHER): Payer: Medicare HMO | Admitting: Internal Medicine

## 2020-05-31 ENCOUNTER — Other Ambulatory Visit: Payer: Self-pay

## 2020-05-31 VITALS — BP 120/70 | HR 44 | Ht 68.0 in | Wt 212.0 lb

## 2020-05-31 DIAGNOSIS — Z0000 Encounter for general adult medical examination without abnormal findings: Secondary | ICD-10-CM | POA: Diagnosis not present

## 2020-05-31 DIAGNOSIS — Z8719 Personal history of other diseases of the digestive system: Secondary | ICD-10-CM | POA: Diagnosis not present

## 2020-05-31 DIAGNOSIS — R7989 Other specified abnormal findings of blood chemistry: Secondary | ICD-10-CM | POA: Diagnosis not present

## 2020-05-31 DIAGNOSIS — Z8679 Personal history of other diseases of the circulatory system: Secondary | ICD-10-CM

## 2020-05-31 DIAGNOSIS — I441 Atrioventricular block, second degree: Secondary | ICD-10-CM

## 2020-05-31 DIAGNOSIS — E7800 Pure hypercholesterolemia, unspecified: Secondary | ICD-10-CM

## 2020-05-31 DIAGNOSIS — Z9049 Acquired absence of other specified parts of digestive tract: Secondary | ICD-10-CM | POA: Diagnosis not present

## 2020-05-31 LAB — POCT URINALYSIS DIPSTICK
Appearance: NEGATIVE
Bilirubin, UA: NEGATIVE
Blood, UA: NEGATIVE
Glucose, UA: NEGATIVE
Ketones, UA: NEGATIVE
Leukocytes, UA: NEGATIVE
Nitrite, UA: NEGATIVE
Odor: NEGATIVE
Protein, UA: NEGATIVE
Spec Grav, UA: 1.02 (ref 1.010–1.025)
Urobilinogen, UA: 0.2 E.U./dL
pH, UA: 6 (ref 5.0–8.0)

## 2020-05-31 LAB — CREATININE, SERUM: Creat: 1.27 mg/dL — ABNORMAL HIGH (ref 0.70–1.18)

## 2020-05-31 MED ORDER — SILDENAFIL CITRATE 100 MG PO TABS: 50.0000 mg | ORAL_TABLET | Freq: Every day | ORAL | 11 refills | Status: DC | PRN
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
## 2020-06-06 NOTE — Patient Instructions (Signed)
Serum creatinine is elevated today in fasting state.  Please return for repeat serum creatinine when you are well-hydrated.  Otherwise continue current medications and follow-up in a year depending on results of creatinine.

## 2020-06-07 ENCOUNTER — Other Ambulatory Visit: Payer: Self-pay

## 2020-06-07 ENCOUNTER — Encounter: Payer: Self-pay | Admitting: Internal Medicine

## 2020-06-07 ENCOUNTER — Ambulatory Visit: Payer: Medicare HMO | Admitting: Internal Medicine

## 2020-06-07 ENCOUNTER — Ambulatory Visit (INDEPENDENT_AMBULATORY_CARE_PROVIDER_SITE_OTHER): Payer: Medicare HMO | Admitting: Internal Medicine

## 2020-06-07 VITALS — BP 120/80 | HR 60 | Ht 68.0 in | Wt 208.0 lb

## 2020-06-07 DIAGNOSIS — N1831 Chronic kidney disease, stage 3a: Secondary | ICD-10-CM

## 2020-06-07 DIAGNOSIS — R7989 Other specified abnormal findings of blood chemistry: Secondary | ICD-10-CM

## 2020-06-07 NOTE — Progress Notes (Signed)
° °  Subjective:    Patient ID: Jacob Le, male    DOB: 1950-11-22, 69 y.o.   MRN: [id number]  HPI 69 year old Male seen for follow-up on elevated serum creatinine noticed an annual Medicare wellness visit on August 16.  On August 10 routine labs were drawn and his creatinine was 1.39.  He was fasting at the time and may not have been well-hydrated.  Today he is well-hydrated and creatinine is 1.27.  Last year creatinine was 1.10 and 1.18 respectively on different blood draws.  In 2019 creatinine was 1.17 and within normal limits.  He recently had ultrasound of his kidneys which shows no blockage.  Patient has history of hyperlipidemia and recently was started on Crestor 5 mg daily.  He takes aspirin 81 mg daily and uses albuterol inhaler as needed.  He does not have history of hypertension.  In March 2019 he had an ST elevation MI and was diagnosed with second-degree AV Mobitz type I block.  Had 2 attacks of pancreatitis due to gallstones 2010 and 2011.  History of pancreatitis due to alcohol in August 2020.  Lipase was mildly elevated 68.  He has cut back on alcohol consumption.  Says he has had a fair amount of NSAID use over the years with musculoskeletal pain.  Plays a lot of pickleball.  Says he has cut back on this recently.   Review of Systems     Objective:   Physical Exam Blood pressure 120/80 pulse 60 pulse oximetry 97% weight 208 pounds height 5 feet 8 inches BMI 31.63  Serum creatinine on August 10 was 1.39 in August 16 1.27.  Last year creatinine was 1.10  Ultrasound shows no obstruction of the urinary tract.  PSA is normal at 2.6.     Assessment & Plan:  Elevated serum creatinine-?  Chronic kidney disease stage IIIa  Plan: Avoid NSAIDs.  Will be referred to [facility] for evaluation..  Discussion about this today.

## 2020-06-10 ENCOUNTER — Ambulatory Visit
Admission: RE | Admit: 2020-06-10 | Discharge: 2020-06-10 | Disposition: A | Discharge status: Home / Self Care | Payer: Medicare HMO | Source: Ambulatory Visit | Attending: Internal Medicine | Admitting: Internal Medicine

## 2020-06-10 DIAGNOSIS — R7989 Other specified abnormal findings of blood chemistry: Secondary | ICD-10-CM | POA: Diagnosis not present

## 2020-06-12 NOTE — Patient Instructions (Addendum)
Ultrasound shows no obstruction.  Referral to Nephrology for evaluation of elevated serum creatinine.

## 2020-06-16 DIAGNOSIS — R69 Illness, unspecified: Secondary | ICD-10-CM | POA: Diagnosis not present

## 2020-07-05 DIAGNOSIS — R69 Illness, unspecified: Secondary | ICD-10-CM | POA: Diagnosis not present

## 2020-07-28 DIAGNOSIS — N1831 Chronic kidney disease, stage 3a: Secondary | ICD-10-CM | POA: Diagnosis not present

## 2020-07-28 DIAGNOSIS — E785 Hyperlipidemia, unspecified: Secondary | ICD-10-CM | POA: Diagnosis not present

## 2020-07-28 DIAGNOSIS — I2510 Atherosclerotic heart disease of native coronary artery without angina pectoris: Secondary | ICD-10-CM | POA: Diagnosis not present

## 2020-07-28 DIAGNOSIS — Z8719 Personal history of other diseases of the digestive system: Secondary | ICD-10-CM | POA: Diagnosis not present

## 2020-07-29 DIAGNOSIS — M545 Low back pain, unspecified: Secondary | ICD-10-CM | POA: Diagnosis not present

## 2020-07-29 DIAGNOSIS — M7061 Trochanteric bursitis, right hip: Secondary | ICD-10-CM | POA: Diagnosis not present

## 2020-08-04 DIAGNOSIS — M7061 Trochanteric bursitis, right hip: Secondary | ICD-10-CM | POA: Diagnosis not present

## 2020-08-17 DIAGNOSIS — M7061 Trochanteric bursitis, right hip: Secondary | ICD-10-CM | POA: Diagnosis not present

## 2020-08-20 DIAGNOSIS — M7061 Trochanteric bursitis, right hip: Secondary | ICD-10-CM | POA: Diagnosis not present

## 2020-08-24 DIAGNOSIS — M7061 Trochanteric bursitis, right hip: Secondary | ICD-10-CM | POA: Diagnosis not present

## 2020-09-15 DIAGNOSIS — L821 Other seborrheic keratosis: Secondary | ICD-10-CM | POA: Diagnosis not present

## 2020-09-15 DIAGNOSIS — Z85828 Personal history of other malignant neoplasm of skin: Secondary | ICD-10-CM | POA: Diagnosis not present

## 2020-09-15 DIAGNOSIS — L814 Other melanin hyperpigmentation: Secondary | ICD-10-CM | POA: Diagnosis not present

## 2020-09-15 DIAGNOSIS — D1801 Hemangioma of skin and subcutaneous tissue: Secondary | ICD-10-CM | POA: Diagnosis not present

## 2020-09-15 DIAGNOSIS — Z1283 Encounter for screening for malignant neoplasm of skin: Secondary | ICD-10-CM | POA: Diagnosis not present

## 2021-02-16 IMAGING — CT CT ANGIO CHEST-ABD-PELV FOR DISSECTION W/ AND WO/W CM
2 of 7 series · 14 of 46 positions shown, 16 images · IV contrast (OMNI 350)
Comparison: CTA chest, abdomen and pelvis 01/10/2018

CLINICAL DATA: Chest and back pain

EXAM:
CT ANGIOGRAPHY CHEST, ABDOMEN AND PELVIS
TECHNIQUE: Multidetector CT imaging through the chest, abdomen and pelvis was
performed using the standard protocol during bolus administration of
intravenous contrast. Multiplanar reconstructed images and MIPs were
obtained and reviewed to evaluate the vascular anatomy.
CONTRAST:  100mL OMNIPAQUE IOHEXOL 350 MG/ML SOLN

[Series 7: dissection 2mm · axial · 0.91mm/px · z∈[+842,+1422]mm · 11 of 326 slices shown, 13 images]
[im 18/326  soft-tissue]
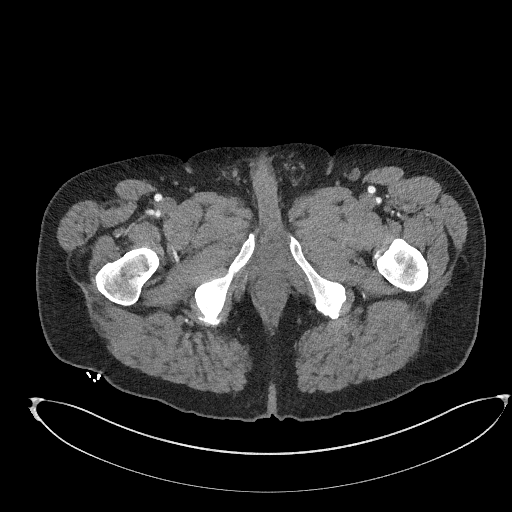
[im 18/326  bone]
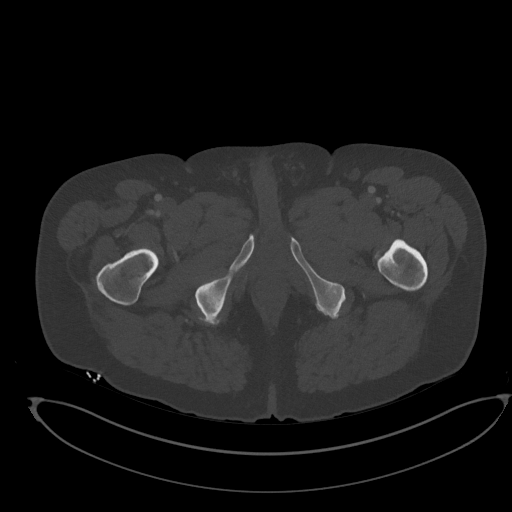
[im 52/326  soft-tissue]
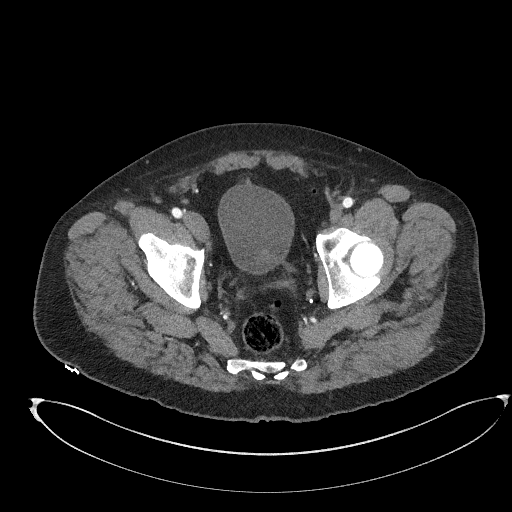
[im 86/326  soft-tissue]
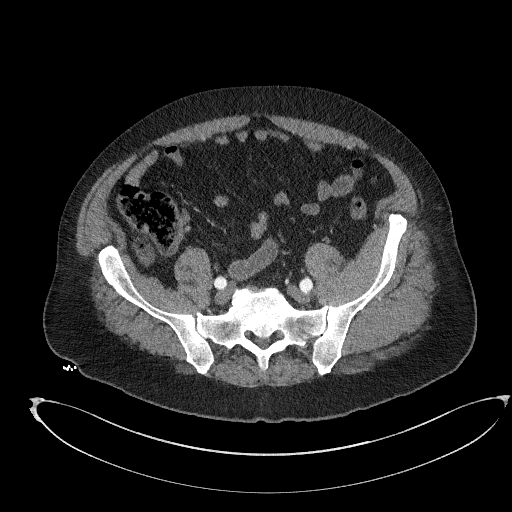
[im 103/326  soft-tissue]
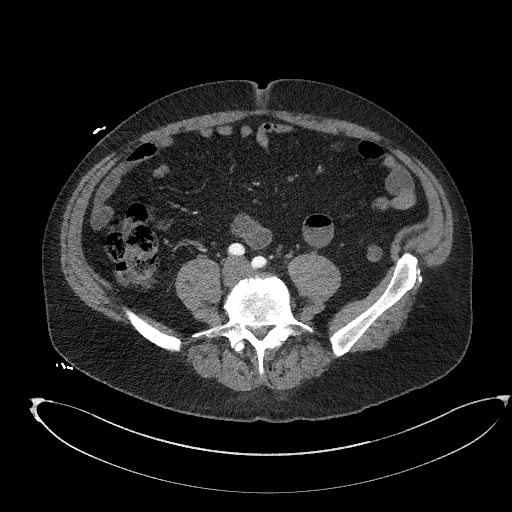
[im 137/326  soft-tissue]
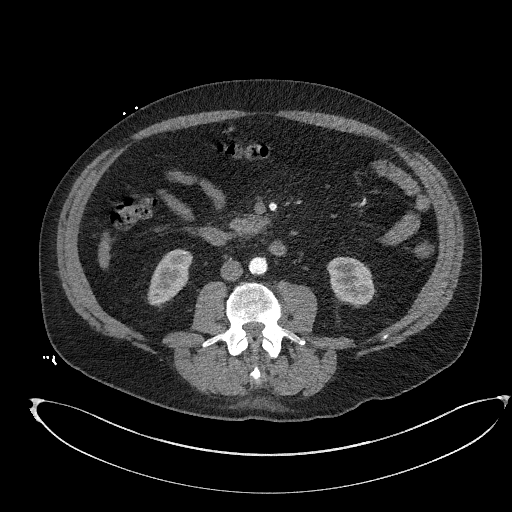
[im 172/326  soft-tissue]
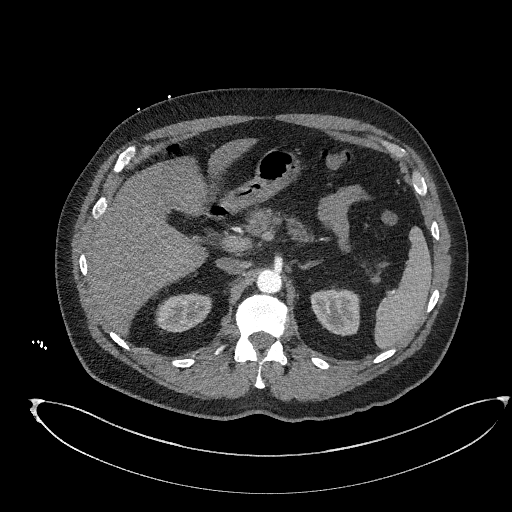
[im 189/326  soft-tissue]
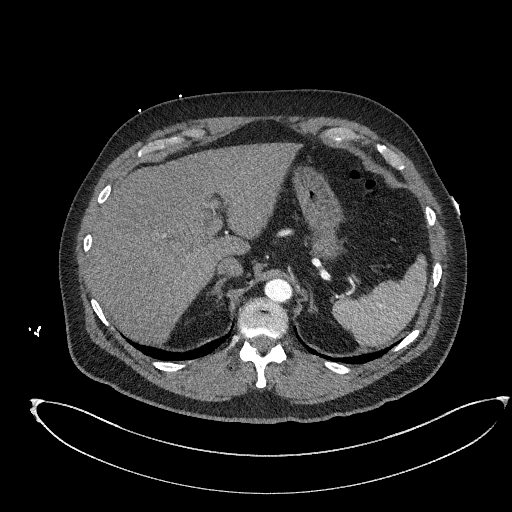
[im 223/326  soft-tissue]
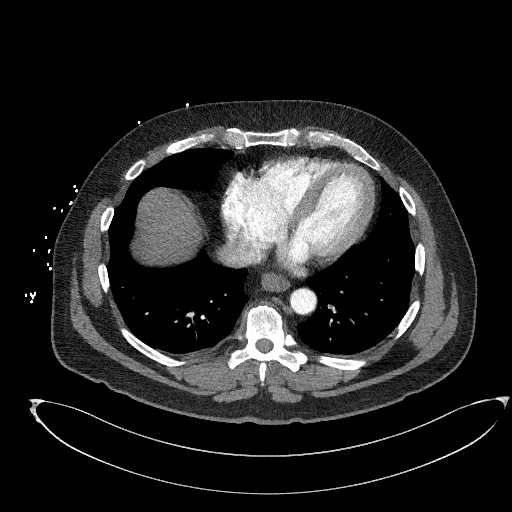
[im 240/326  soft-tissue]
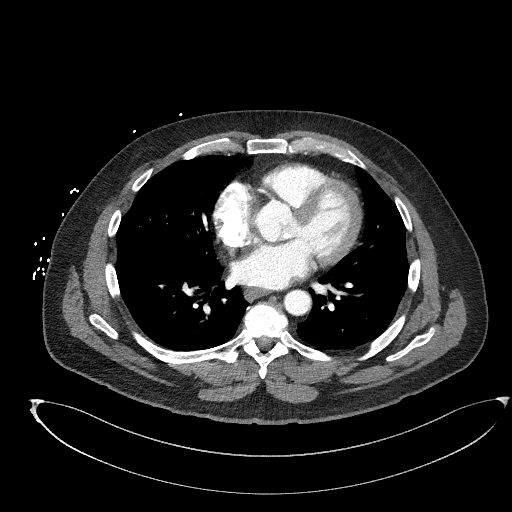
[im 240/326  bone]
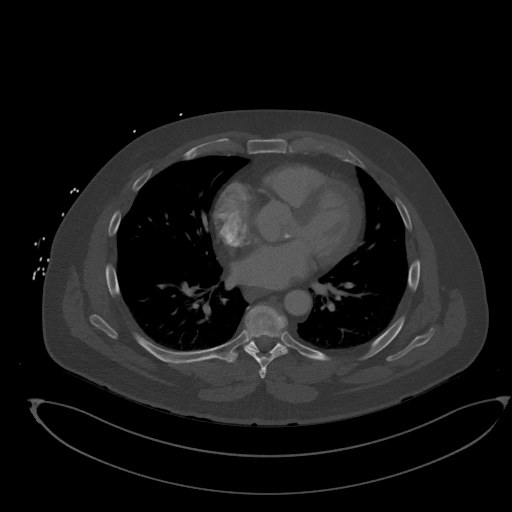
[im 274/326  soft-tissue]
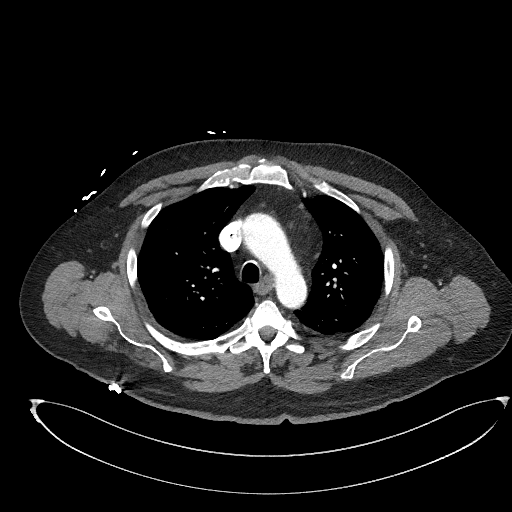
[im 308/326  soft-tissue]
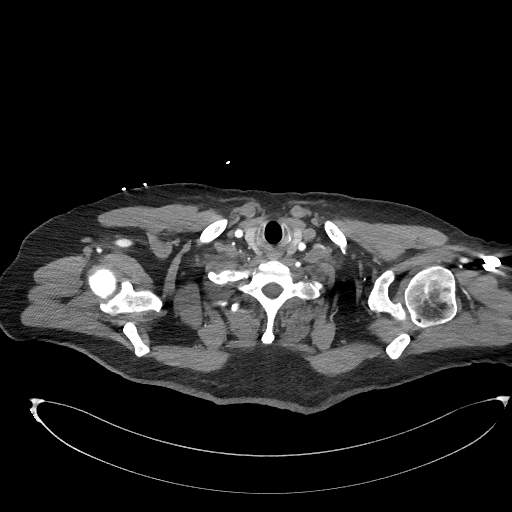

[Series 10: dissection 2mm cor · coronal · 0.84mm/px · 3 of 166 slices shown]
[im 42/166  soft-tissue]
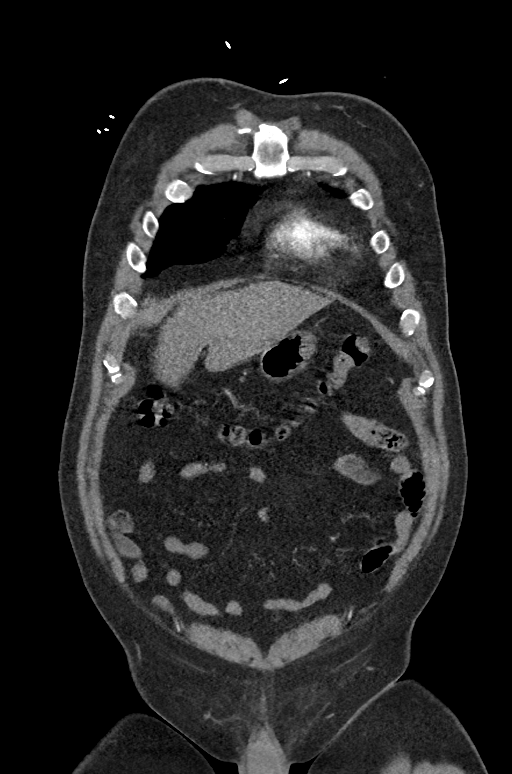
[im 83/166  soft-tissue]
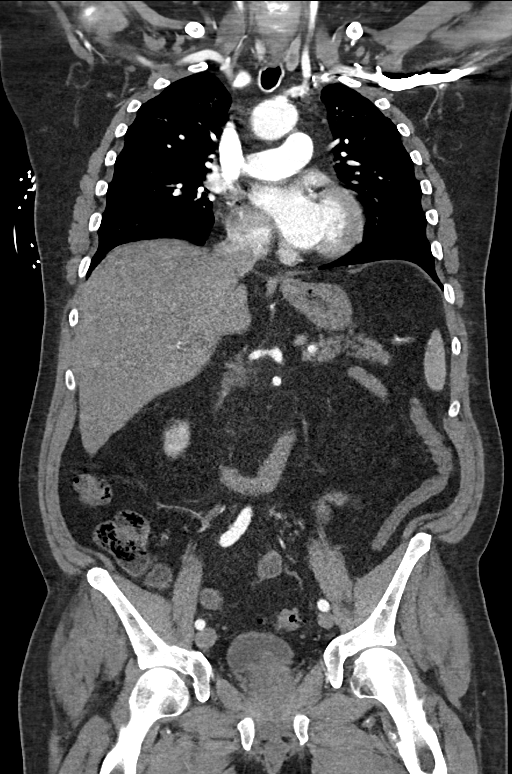
[im 124/166  soft-tissue]
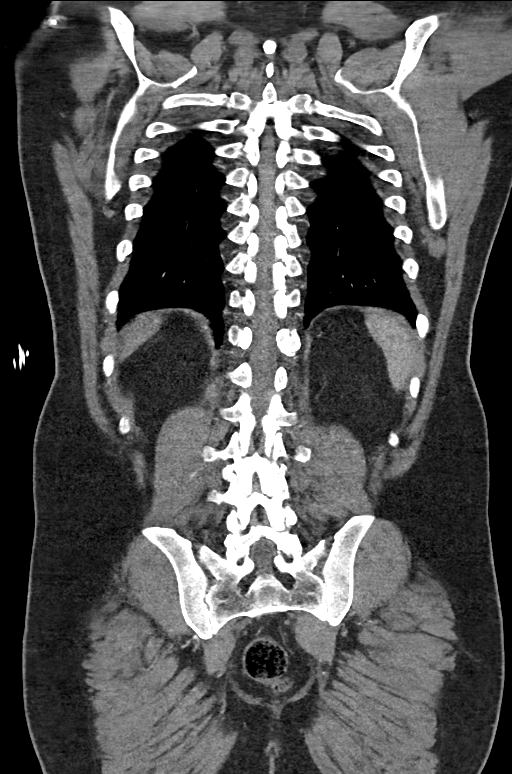

[14 of 46 positions shown; findings below may reference images not displayed]

FINDINGS: CTA CHEST FINDINGS

Cardiovascular: No filling defects in the pulmonary arteries to
suggest pulmonary emboli. Heart is normal size. Aorta is normal
caliber with scattered calcifications. No dissection. Scattered
coronary artery calcifications in the left anterior descending
coronary artery.

Mediastinum/Nodes: No mediastinal, hilar, or axillary adenopathy.
Small hiatal hernia. Trachea and esophagus are unremarkable.

Lungs/Pleura: Lungs are clear. No focal airspace opacities or
suspicious nodules. No effusions.

Musculoskeletal: Chest wall soft tissues are unremarkable. No acute
bony abnormality.

Review of the MIP images confirms the above findings.

CTA ABDOMEN AND PELVIS FINDINGS

VASCULAR

Aorta: Scattered aortic calcifications.  No aneurysm or dissection.

Celiac: Patent without evidence of aneurysm, dissection, vasculitis
or significant stenosis.

SMA: Patent without evidence of aneurysm, dissection, vasculitis or
significant stenosis.

Renals: Both renal arteries are patent without evidence of aneurysm,
dissection, vasculitis, fibromuscular dysplasia or significant
stenosis.

IMA: Patent without evidence of aneurysm, dissection, vasculitis or
significant stenosis.

Inflow: Patent without evidence of aneurysm, dissection, vasculitis
or significant stenosis.

Veins: No obvious venous abnormality within the limitations of this
arterial phase study.

Review of the MIP images confirms the above findings.

NON-VASCULAR

Hepatobiliary: No focal liver abnormality is seen. Status post
cholecystectomy. No biliary dilatation.

Pancreas: No focal abnormality or ductal dilatation.

Spleen: No focal abnormality.  Normal size.

Adrenals/Urinary Tract: No adrenal abnormality. No focal renal
abnormality. No stones or hydronephrosis. Urinary bladder is
unremarkable.

Stomach/Bowel: Sigmoid diverticulosis. No active diverticulitis.
Stomach and small bowel decompressed, unremarkable.

Lymphatic: No adenopathy.

Reproductive: Prostate enlargement with central calcifications.

Other: No free fluid or free air. Bilateral inguinal hernias
containing fat.

Musculoskeletal: No acute bony abnormality.

Review of the MIP images confirms the above findings.
IMPRESSION: No evidence of aortic aneurysm or dissection.

No evidence of pulmonary embolus.

Scattered aortic calcifications.

Coronary artery disease.

Small hiatal hernia.

Sigmoid diverticulosis.  No active diverticulitis.

Small bilateral inguinal hernias containing fat.

Prostate enlargement.

No acute findings.

## 2021-02-16 IMAGING — DX PORTABLE CHEST - 1 VIEW
1 series · 1 of 1 positions shown · non-contrast
Comparison: Radiographs December 17, 2017.

CLINICAL DATA: Epigastric abdominal pain.

EXAM:
PORTABLE CHEST 1 VIEW

[chest ap]
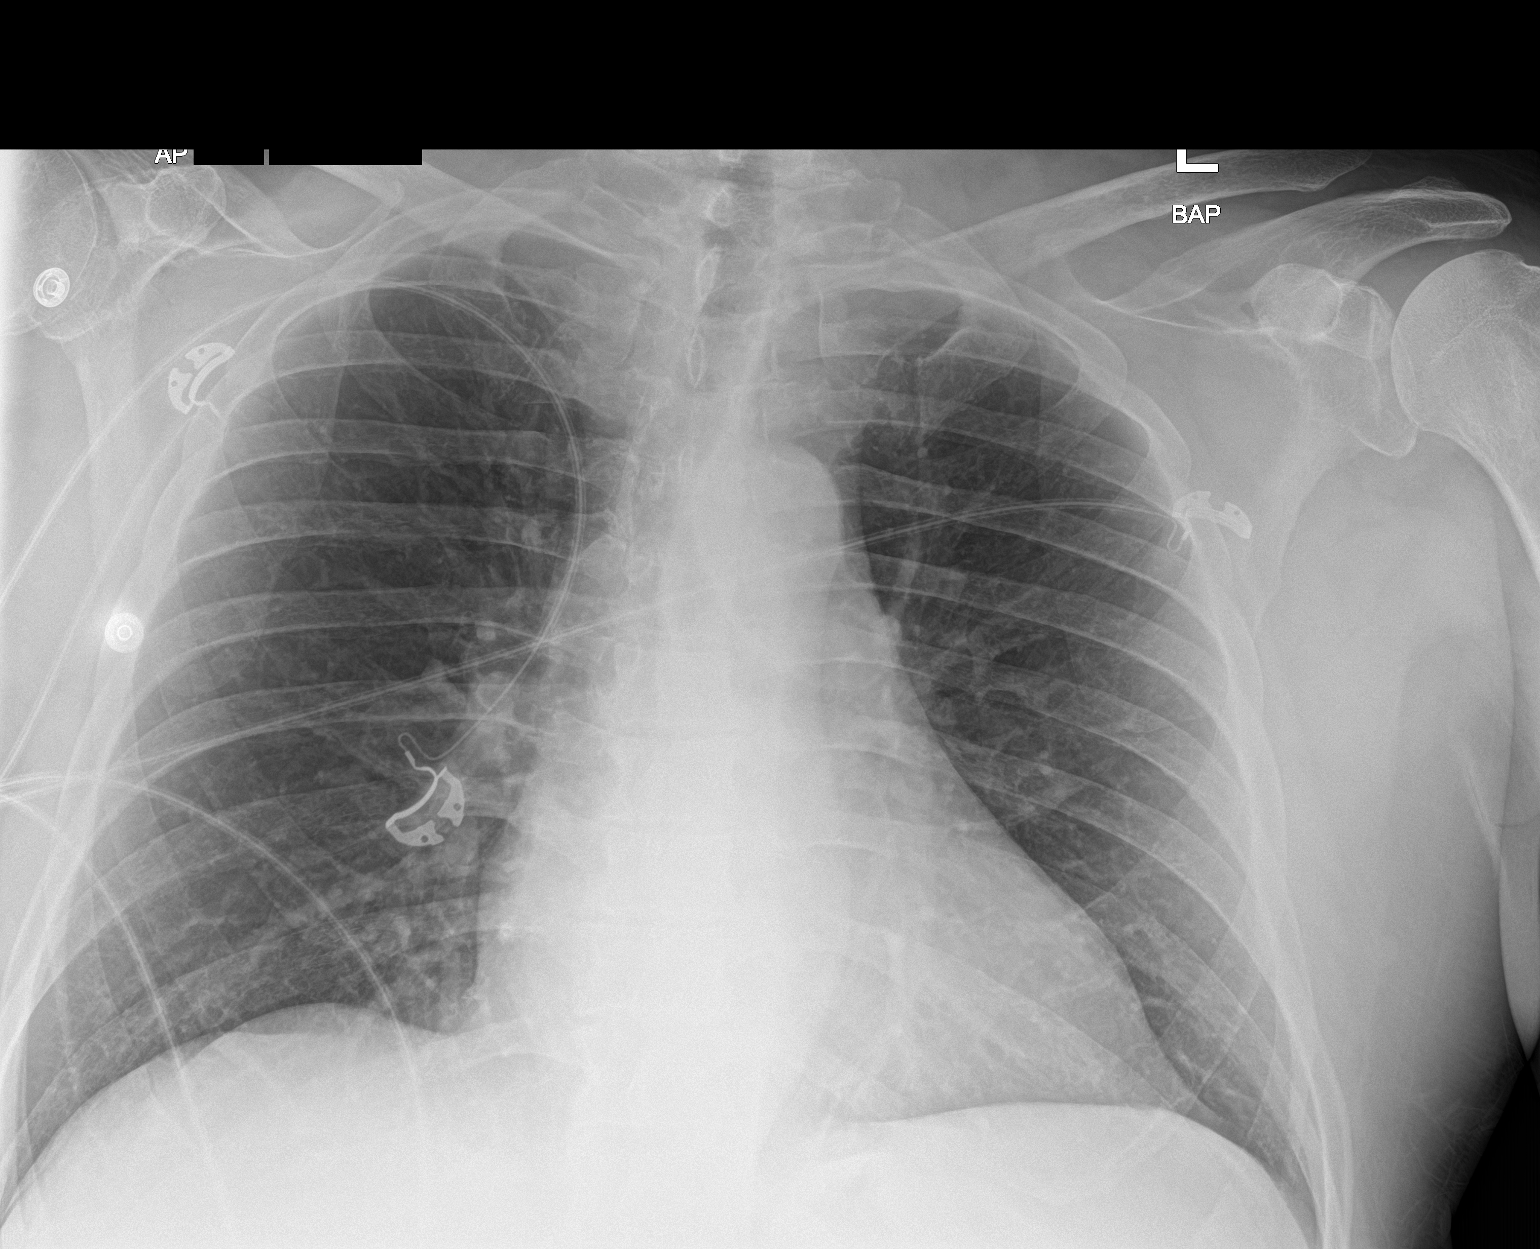

[1 of 1 positions shown; findings below may reference images not displayed]

FINDINGS: The heart size and mediastinal contours are within normal limits.
Both lungs are clear. No pneumothorax or pleural effusion is noted.
The visualized skeletal structures are unremarkable.
IMPRESSION: No active disease.

## 2021-02-17 IMAGING — US ULTRASOUND ABDOMEN LIMITED
1 series · 14 of 25 positions shown · non-contrast
Comparison: None.

CLINICAL DATA: Elevated LFTs

EXAM:
ULTRASOUND ABDOMEN LIMITED RIGHT UPPER QUADRANT

[Series 1: ultrasound abdomen limited · 14 of 25 slices shown]
[im 1/25]
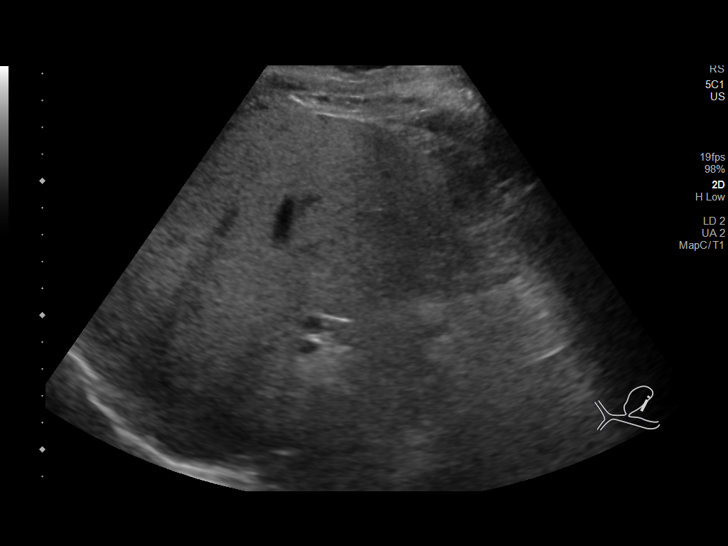
[im 3/25]
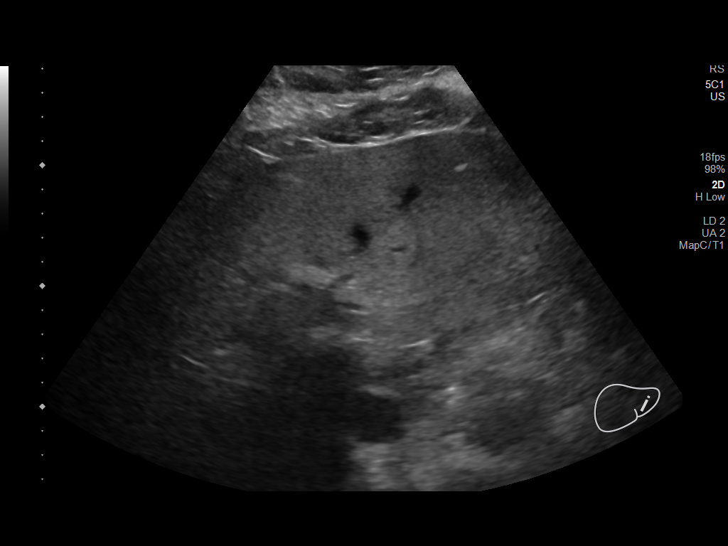
[im 5/25]
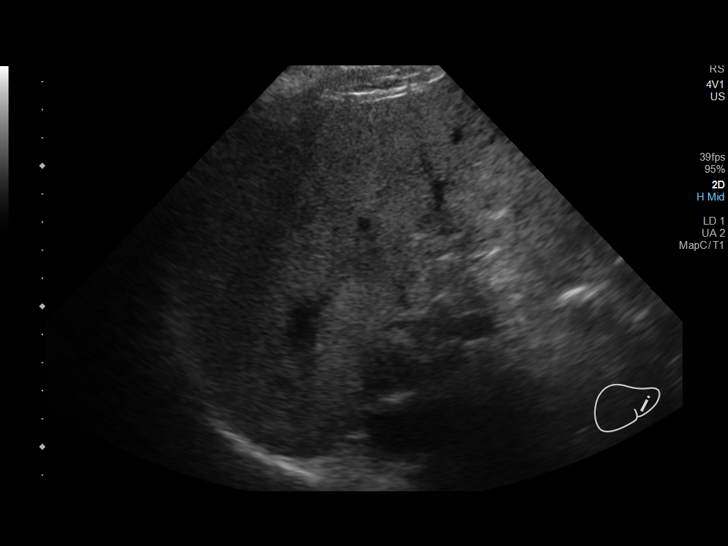
[im 7/25]
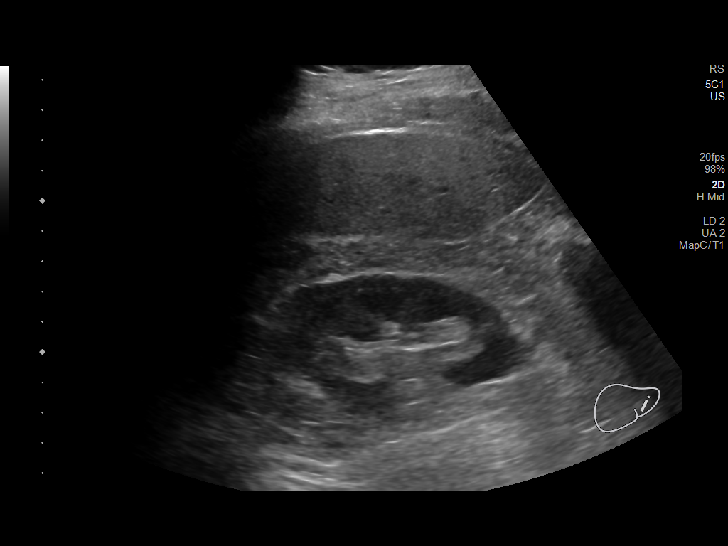
[im 9/25]
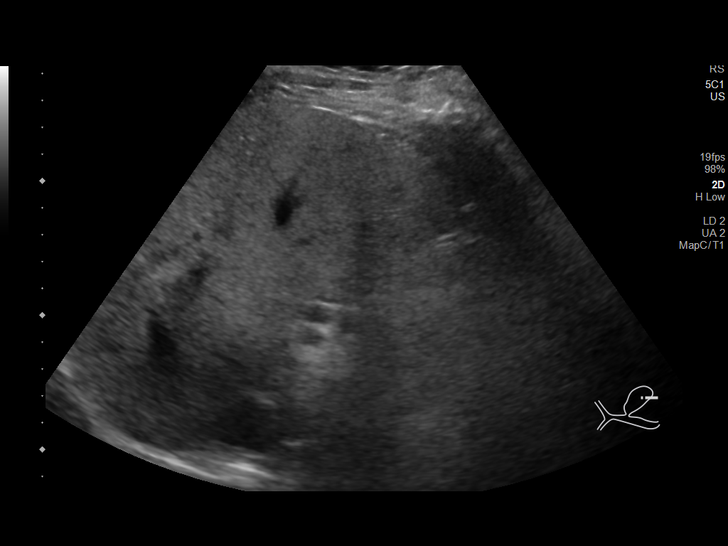
[im 10/25]
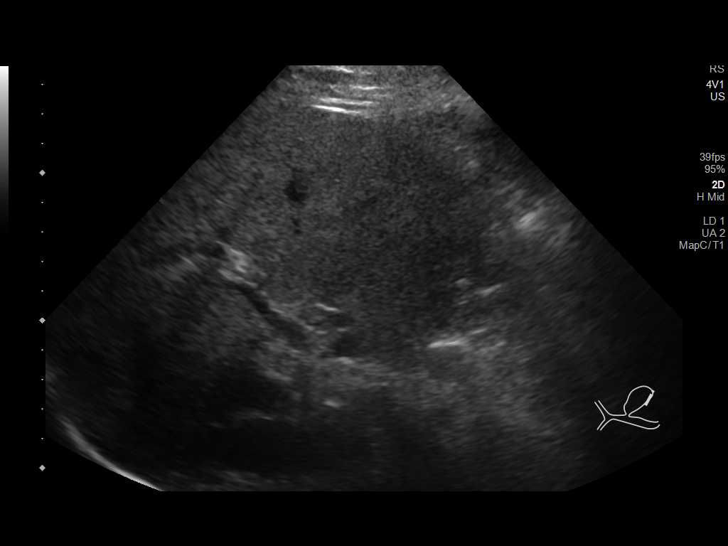
[im 12/25]
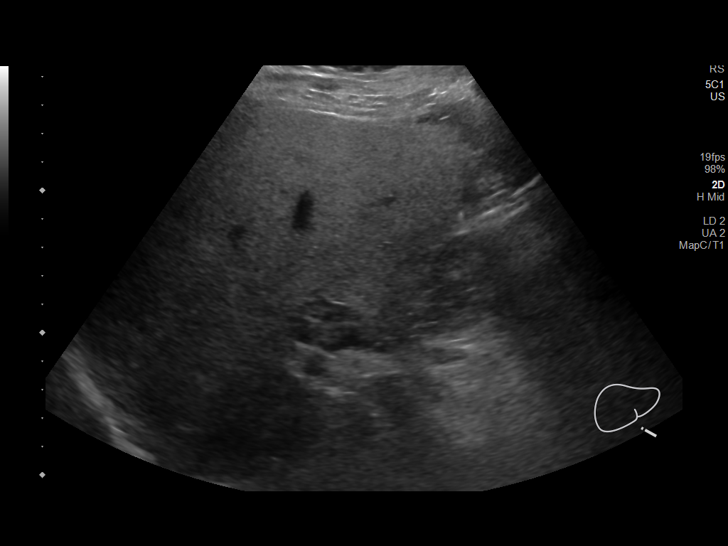
[im 14/25]
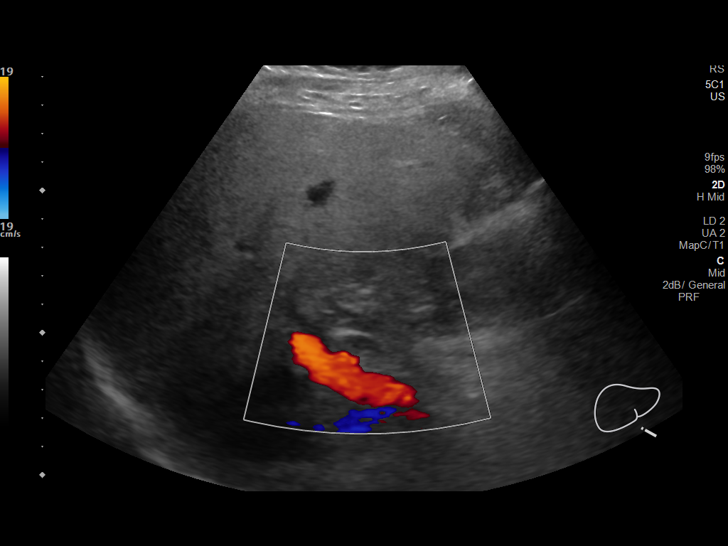
[im 16/25]
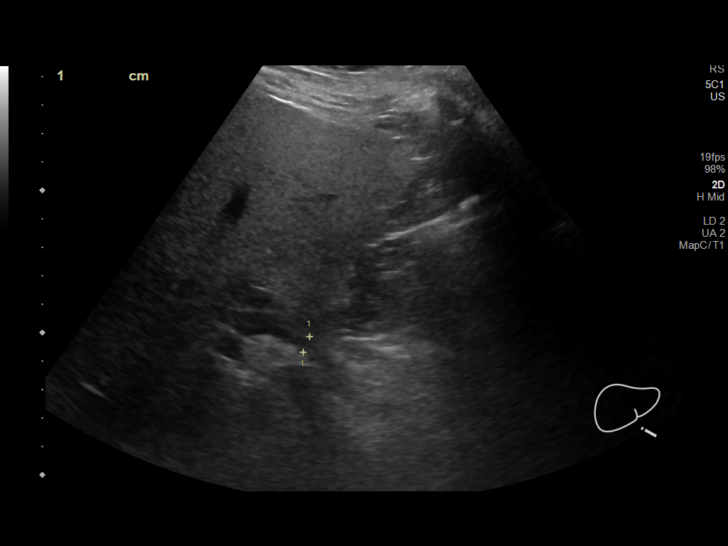
[im 17/25]
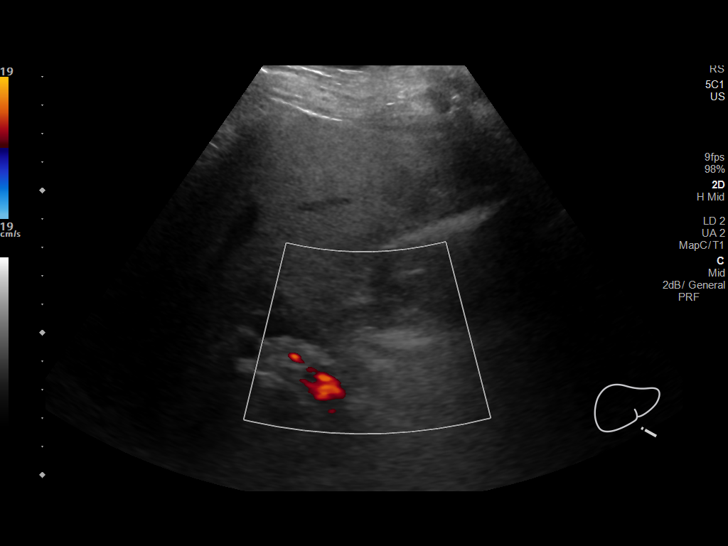
[im 19/25]
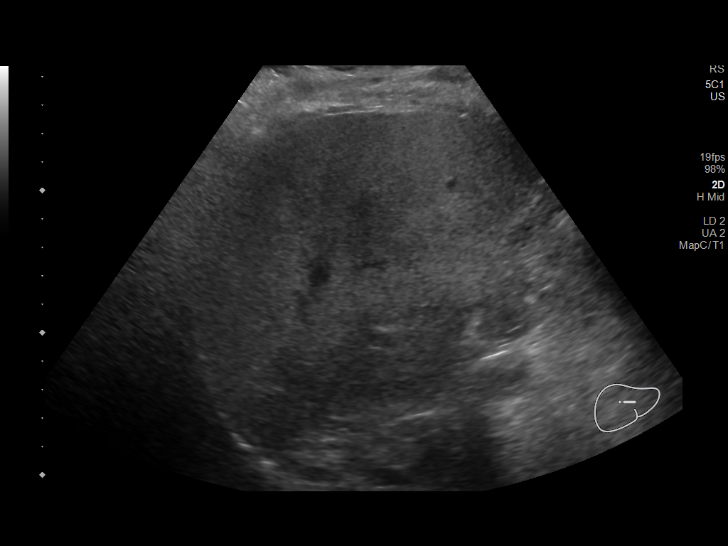
[im 21/25]
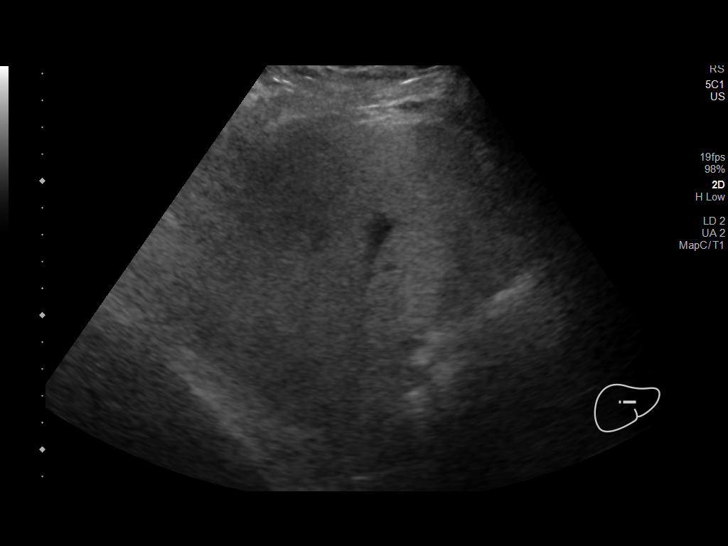
[im 23/25]
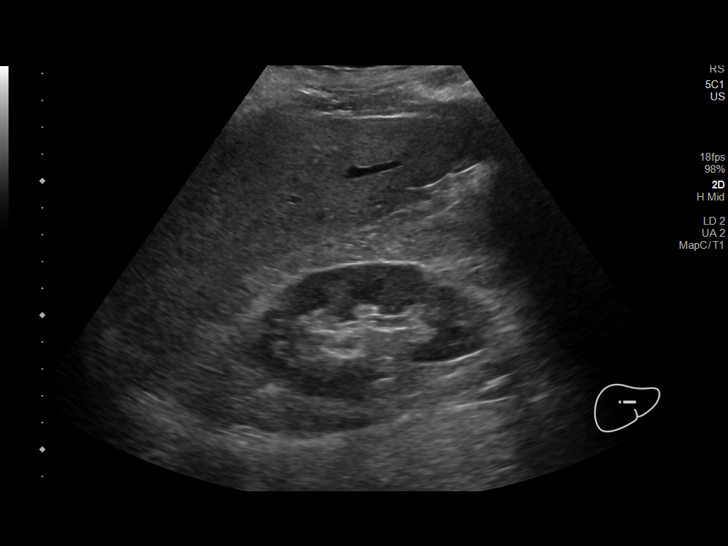
[im 25/25]
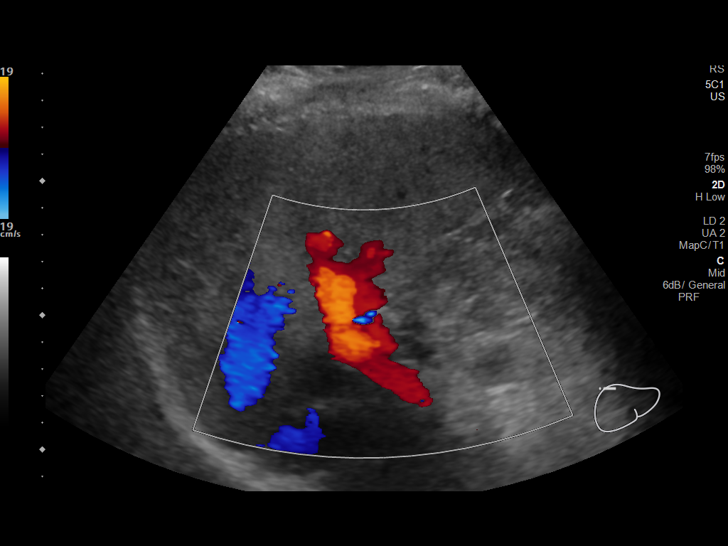

[14 of 25 positions shown; findings below may reference images not displayed]

FINDINGS: Gallbladder:

Prior cholecystectomy.

Common bile duct:

Diameter: 5.9 mm

Liver:

. No focal lesion identified. Increased hepatic parenchymal
echogenicity. Portal vein is patent on color Doppler imaging with
normal direction of blood flow towards the liver.

Other: None.
IMPRESSION: Increased hepatic echogenicity as can be seen with hepatic
steatosis.

## 2021-02-21 ENCOUNTER — Other Ambulatory Visit: Payer: Self-pay

## 2021-02-21 ENCOUNTER — Other Ambulatory Visit (HOSPITAL_BASED_OUTPATIENT_CLINIC_OR_DEPARTMENT_OTHER): Payer: Self-pay

## 2021-02-21 ENCOUNTER — Ambulatory Visit: Payer: Medicare HMO | Attending: Internal Medicine

## 2021-02-21 DIAGNOSIS — Z23 Encounter for immunization: Secondary | ICD-10-CM

## 2021-02-21 MED ORDER — COVID-19 MRNA VACC (MODERNA) 100 MCG/0.5ML IM SUSP
INTRAMUSCULAR | 0 refills | Status: DC
  Filled 2021-02-21: qty 0.25, 1d supply, fill #0

## 2021-02-21 NOTE — Progress Notes (Signed)
   Covid-19 Vaccination Clinic  Name:  Jacob Le    MRN: [id number] DOB: 09/26/1951  02/21/2021  Jacob Le was observed post Covid-19 immunization for 15 minutes without incident. He was provided with Vaccine Information Sheet and instruction to access the V-Safe system.   Jacob Le was instructed to call 911 with any severe reactions post vaccine: Marland Kitchen Difficulty breathing  . Swelling of face and throat  . A fast heartbeat  . A bad rash all over body  . Dizziness and weakness   Immunizations Administered    Name Date Dose VIS Date Route   Moderna Covid-19 Booster Vaccine 02/21/2021  3:32 PM 0.25 mL 08/04/2020 Intramuscular   Manufacturer: Moderna   Lot: [id number]   [place]: [id number]

## 2021-06-14 ENCOUNTER — Other Ambulatory Visit: Payer: Self-pay | Admitting: Internal Medicine

## 2021-08-01 ENCOUNTER — Other Ambulatory Visit: Payer: Self-pay

## 2021-08-01 ENCOUNTER — Other Ambulatory Visit: Payer: Medicare HMO | Admitting: Internal Medicine

## 2021-08-01 DIAGNOSIS — E785 Hyperlipidemia, unspecified: Secondary | ICD-10-CM

## 2021-08-01 DIAGNOSIS — Z125 Encounter for screening for malignant neoplasm of prostate: Secondary | ICD-10-CM | POA: Diagnosis not present

## 2021-08-01 DIAGNOSIS — Z0000 Encounter for general adult medical examination without abnormal findings: Secondary | ICD-10-CM | POA: Diagnosis not present

## 2021-08-01 DIAGNOSIS — R030 Elevated blood-pressure reading, without diagnosis of hypertension: Secondary | ICD-10-CM | POA: Diagnosis not present

## 2021-08-01 DIAGNOSIS — N1831 Chronic kidney disease, stage 3a: Secondary | ICD-10-CM | POA: Diagnosis not present

## 2021-08-01 DIAGNOSIS — I2510 Atherosclerotic heart disease of native coronary artery without angina pectoris: Secondary | ICD-10-CM | POA: Diagnosis not present

## 2021-08-02 LAB — CBC WITH DIFFERENTIAL/PLATELET
Absolute Monocytes: 498 cells/uL (ref 200–950)
Basophils Absolute: 42 cells/uL (ref 0–200)
Basophils Relative: 0.7 %
Eosinophils Absolute: 282 cells/uL (ref 15–500)
Eosinophils Relative: 4.7 %
HCT: 46.7 % (ref 38.5–50.0)
Hemoglobin: 16 g/dL (ref 13.2–17.1)
Lymphs Abs: 2562 cells/uL (ref 850–3900)
MCH: 31 pg (ref 27.0–33.0)
MCHC: 34.3 g/dL (ref 32.0–36.0)
MCV: 90.5 fL (ref 80.0–100.0)
MPV: 10.2 fL (ref 7.5–12.5)
Monocytes Relative: 8.3 %
Neutro Abs: 2616 cells/uL (ref 1500–7800)
Neutrophils Relative %: 43.6 %
Platelets: 248 10*3/uL (ref 140–400)
RBC: 5.16 10*6/uL (ref 4.20–5.80)
RDW: 12.7 % (ref 11.0–15.0)
Total Lymphocyte: 42.7 %
WBC: 6 10*3/uL (ref 3.8–10.8)

## 2021-08-02 LAB — COMPLETE METABOLIC PANEL WITH GFR
AG Ratio: 2.3 (calc) (ref 1.0–2.5)
ALT: 33 U/L (ref 9–46)
AST: 29 U/L (ref 10–35)
Albumin: 4.9 g/dL (ref 3.6–5.1)
Alkaline phosphatase (APISO): 57 U/L (ref 35–144)
BUN: 18 mg/dL (ref 7–25)
CO2: 28 mmol/L (ref 20–32)
Calcium: 9.8 mg/dL (ref 8.6–10.3)
Chloride: 105 mmol/L (ref 98–110)
Creat: 1.2 mg/dL (ref 0.70–1.28)
Globulin: 2.1 g/dL (calc) (ref 1.9–3.7)
Glucose, Bld: 114 mg/dL — ABNORMAL HIGH (ref 65–99)
Potassium: 4.9 mmol/L (ref 3.5–5.3)
Sodium: 140 mmol/L (ref 135–146)
Total Bilirubin: 1.6 mg/dL — ABNORMAL HIGH (ref 0.2–1.2)
Total Protein: 7 g/dL (ref 6.1–8.1)
eGFR: 65 mL/min/{1.73_m2} (ref >=60)

## 2021-08-02 LAB — LIPID PANEL
Cholesterol: 159 mg/dL (ref <200)
HDL: 51 mg/dL (ref >=40)
LDL Cholesterol (Calc): 84 mg/dL (calc)
Non-HDL Cholesterol (Calc): 108 mg/dL (calc) (ref <130)
Total CHOL/HDL Ratio: 3.1 (calc) (ref <5.0)
Triglycerides: 147 mg/dL (ref <150)

## 2021-08-02 LAB — PSA: PSA: 1 ng/mL (ref <=4.00)

## 2021-08-04 ENCOUNTER — Ambulatory Visit (INDEPENDENT_AMBULATORY_CARE_PROVIDER_SITE_OTHER): Payer: Medicare HMO | Admitting: Internal Medicine

## 2021-08-04 ENCOUNTER — Other Ambulatory Visit: Payer: Self-pay

## 2021-08-04 ENCOUNTER — Encounter: Payer: Self-pay | Admitting: Internal Medicine

## 2021-08-04 VITALS — BP 132/78 | HR 63 | Temp 99.0°F | Ht 68.0 in | Wt 209.0 lb

## 2021-08-04 DIAGNOSIS — Z8719 Personal history of other diseases of the digestive system: Secondary | ICD-10-CM

## 2021-08-04 DIAGNOSIS — E7800 Pure hypercholesterolemia, unspecified: Secondary | ICD-10-CM

## 2021-08-04 DIAGNOSIS — Z8679 Personal history of other diseases of the circulatory system: Secondary | ICD-10-CM

## 2021-08-04 DIAGNOSIS — N1831 Chronic kidney disease, stage 3a: Secondary | ICD-10-CM

## 2021-08-04 DIAGNOSIS — Z9049 Acquired absence of other specified parts of digestive tract: Secondary | ICD-10-CM

## 2021-08-04 DIAGNOSIS — Z0000 Encounter for general adult medical examination without abnormal findings: Secondary | ICD-10-CM

## 2021-08-04 LAB — POCT URINALYSIS DIPSTICK
Bilirubin, UA: NEGATIVE
Blood, UA: NEGATIVE
Glucose, UA: NEGATIVE
Ketones, UA: NEGATIVE
Leukocytes, UA: NEGATIVE
Nitrite, UA: NEGATIVE
Protein, UA: NEGATIVE
Spec Grav, UA: 1.02 (ref 1.010–1.025)
Urobilinogen, UA: 0.2 E.U./dL
pH, UA: 5 (ref 5.0–8.0)
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
## 2021-08-16 DIAGNOSIS — H25811 Combined forms of age-related cataract, right eye: Secondary | ICD-10-CM | POA: Diagnosis not present

## 2021-08-16 DIAGNOSIS — H21233 Degeneration of iris (pigmentary), bilateral: Secondary | ICD-10-CM | POA: Diagnosis not present

## 2021-09-06 DIAGNOSIS — M9904 Segmental and somatic dysfunction of sacral region: Secondary | ICD-10-CM | POA: Diagnosis not present

## 2021-09-06 DIAGNOSIS — M9905 Segmental and somatic dysfunction of pelvic region: Secondary | ICD-10-CM | POA: Diagnosis not present

## 2021-09-06 DIAGNOSIS — M25551 Pain in right hip: Secondary | ICD-10-CM | POA: Diagnosis not present

## 2021-09-06 DIAGNOSIS — M9906 Segmental and somatic dysfunction of lower extremity: Secondary | ICD-10-CM | POA: Diagnosis not present

## 2021-09-06 DIAGNOSIS — M9903 Segmental and somatic dysfunction of lumbar region: Secondary | ICD-10-CM | POA: Diagnosis not present

## 2021-09-06 DIAGNOSIS — M25552 Pain in left hip: Secondary | ICD-10-CM | POA: Diagnosis not present

## 2021-09-11 ENCOUNTER — Other Ambulatory Visit: Payer: Self-pay

## 2021-09-11 ENCOUNTER — Emergency Department (HOSPITAL_BASED_OUTPATIENT_CLINIC_OR_DEPARTMENT_OTHER)
Admission: EM | Admit: 2021-09-11 | Discharge: 2021-09-11 | Disposition: A | Discharge status: Home / Self Care | Payer: Medicare HMO | Attending: Emergency Medicine | Admitting: Emergency Medicine

## 2021-09-11 ENCOUNTER — Encounter (HOSPITAL_BASED_OUTPATIENT_CLINIC_OR_DEPARTMENT_OTHER): Payer: Self-pay

## 2021-09-11 DIAGNOSIS — Z8719 Personal history of other diseases of the digestive system: Secondary | ICD-10-CM | POA: Insufficient documentation

## 2021-09-11 DIAGNOSIS — J101 Influenza due to other identified influenza virus with other respiratory manifestations: Secondary | ICD-10-CM | POA: Insufficient documentation

## 2021-09-11 DIAGNOSIS — Z7982 Long term (current) use of aspirin: Secondary | ICD-10-CM | POA: Diagnosis not present

## 2021-09-11 DIAGNOSIS — R5383 Other fatigue: Secondary | ICD-10-CM | POA: Diagnosis not present

## 2021-09-11 DIAGNOSIS — Z20822 Contact with and (suspected) exposure to covid-19: Secondary | ICD-10-CM | POA: Insufficient documentation

## 2021-09-11 DIAGNOSIS — Z8679 Personal history of other diseases of the circulatory system: Secondary | ICD-10-CM | POA: Insufficient documentation

## 2021-09-11 LAB — RESP PANEL BY RT-PCR (FLU A&B, COVID) ARPGX2
Influenza A by PCR: NEGATIVE
Influenza B by PCR: NEGATIVE
SARS Coronavirus 2 by RT PCR: NEGATIVE

## 2021-09-11 MED ORDER — OSELTAMIVIR PHOSPHATE 75 MG PO CAPS: 75.0000 mg | ORAL_CAPSULE | Freq: Two times a day (BID) | ORAL | 0 refills | Status: DC

## 2021-09-11 NOTE — ED Triage Notes (Signed)
Pt presents with congestion, body aches, fever, chills and congestion x1 day. Family recently tested positive for the flu

## 2021-09-11 NOTE — ED Provider Notes (Signed)
[facility] EMERGENCY DEPT Provider Note   CSN: [id number] Arrival date & time: 09/11/21  1736     History Chief Complaint  Patient presents with   Fatigue    Jacob Le is a 70 y.o. male.  The history is provided by the patient.  URI Presenting symptoms: congestion, cough, fatigue and sore throat   Severity:  Moderate Onset quality:  Gradual Duration:  2 days Timing:  Constant Progression:  Worsening Chronicity:  New Relieved by:  OTC medications Worsened by:  Nothing Ineffective treatments:  None tried Associated symptoms: myalgias   Associated symptoms comment:  Fever for the first time today after waking up from a nap Risk factors: sick contacts   Risk factors comment:  Wife recently tested positive for flu A     Past Medical History:  Diagnosis Date   Dysrhythmia    2nd degree heart block, Type 1   Sleep apnea     Patient Active Problem List   Diagnosis Date Noted   History of pancreatitis 09/11/2021   History of pericarditis 09/11/2021   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   Spinal stenosis of lumbar region 04/30/2019   Allergic rhinitis 11/12/2017   OSA on CPAP 01/04/2017    Past Surgical History:  Procedure Laterality Date   broken left shoulder     CHOLECYSTECTOMY     LEFT HEART CATH AND CORONARY ANGIOGRAPHY N/A 12/18/2017   Procedure: LEFT HEART CATH AND CORONARY ANGIOGRAPHY;  Surgeon: Burnell Blanks, MD;  Location: [facility];  Service: Cardiovascular;  Laterality: N/A;   pancreatis attack     pericarditis     torn right rotator cuff         Family History  Problem Relation Age of Onset   Cancer Mother     Social History   Tobacco Use   Smoking status: Never   Smokeless tobacco: Never  Vaping Use   Vaping Use: Never used  Substance Use Topics   Alcohol use: Yes    Comment: 4oz weekly    Drug use: No    Home Medications Prior to Admission medications   Medication Sig  Start Date End Date Taking? Authorizing Provider  oseltamivir (TAMIFLU) 75 MG capsule Take 1 capsule (75 mg total) by mouth every 12 (twelve) hours. 09/11/21  Yes Ludwika Rodd, Loree Fee, MD  albuterol (VENTOLIN HFA) 108 (90 Base) MCG/ACT inhaler Inhale 2 puffs into the lungs every 6 (six) hours as needed for wheezing or shortness of breath. 02/13/20   Elby Showers, MD  aspirin EC 81 MG EC tablet Take 1 tablet (81 mg total) by mouth daily. Patient taking differently: Take 81 mg by mouth every evening. 12/19/17   Bhagat, Crista Luria, PA  co-enzyme Q-10 30 MG capsule Take 30 mg by mouth daily.    [provider]  COVID-19 mRNA vaccine, Moderna, 100 MCG/0.5ML injection Inject into the muscle. 02/21/21   Carlyle Basques, MD  rosuvastatin (CRESTOR) 5 MG tablet TAKE 1 TABLET BY MOUTH EVERY DAY 06/14/21   Elby Showers, MD  sildenafil (VIAGRA) 100 MG tablet Take 0.5-1 tablets (50-100 mg total) by mouth daily as needed for erectile dysfunction. 05/31/20   Elby Showers, MD    Allergies    Codeine and Other  Review of Systems   Review of Systems  Constitutional:  Positive for fatigue.  HENT:  Positive for congestion and sore throat.   Respiratory:  Positive for cough.   Musculoskeletal:  Positive for myalgias.  All other systems reviewed and are negative.  Physical Exam Updated Vital Signs BP (!) 163/88   Pulse 71   Temp 98.6 F (37 C)   Resp 15   SpO2 96%   Physical Exam Vitals and nursing note reviewed.  Constitutional:      General: He is not in acute distress.    Appearance: He is well-developed.  HENT:     Head: Normocephalic and atraumatic.     Mouth/Throat:     Mouth: Mucous membranes are moist.     Pharynx: Posterior oropharyngeal erythema present. No oropharyngeal exudate.  Eyes:     Conjunctiva/sclera: Conjunctivae normal.     Pupils: Pupils are equal, round, and reactive to light.  Cardiovascular:     Rate and Rhythm: Normal rate and regular rhythm.     Heart sounds:  No murmur heard. Pulmonary:     Effort: Pulmonary effort is normal. No respiratory distress.     Breath sounds: Normal breath sounds. No wheezing or rales.  Abdominal:     General: There is no distension.     Palpations: Abdomen is soft.     Tenderness: There is no abdominal tenderness. There is no guarding or rebound.  Musculoskeletal:        General: No tenderness. Normal range of motion.     Cervical back: Normal range of motion and neck supple.  Skin:    General: Skin is warm and dry.     Findings: No erythema or rash.  Neurological:     Mental Status: He is alert and oriented to person, place, and time. Mental status is at baseline.  Psychiatric:        Behavior: Behavior normal.    ED Results / Procedures / Treatments   Labs (all labs ordered are listed, but only abnormal results are displayed) Labs Reviewed  RESP PANEL BY RT-PCR (FLU A&B, COVID) ARPGX2    EKG None  Radiology No results found.  Procedures Procedures   Medications Ordered in ED Medications - No data to display  ED Course  I have reviewed the triage vital signs and the nursing notes.  Pertinent labs & imaging results that were available during my care of the patient were reviewed by me and considered in my medical decision making (see chart for details).    MDM Rules/Calculators/A&P                           Pt with symptoms consistent with influenza and wife tested positive earlier this week.  Normal exam here.  No signs of breathing difficulty  No signs of strep pharyngitis, otitis or abnormal abdominal findings.   Flu and covid pending.  Will continue antipyretica and rest and fluids and return for any further problems.  Final Clinical Impression(s) / ED Diagnoses Final diagnoses:  Influenza A    Rx / DC Orders ED Discharge Orders          Ordered    oseltamivir (TAMIFLU) 75 MG capsule  Every 12 hours        09/11/21 Ellin Mayhew, MD 09/11/21 1903

## 2021-09-11 NOTE — Patient Instructions (Signed)
It was a pleasure to see you today.  Take statin medication daily.  Return in 1 year or as needed.  Recommend flu vaccine.  COVID booster given 08/05/2021.  Pneumococcal vaccines are up-to-date.  Please call gastroenterologist about colonoscopy.

## 2021-09-28 DIAGNOSIS — Z85828 Personal history of other malignant neoplasm of skin: Secondary | ICD-10-CM | POA: Diagnosis not present

## 2021-09-28 DIAGNOSIS — D1801 Hemangioma of skin and subcutaneous tissue: Secondary | ICD-10-CM | POA: Diagnosis not present

## 2021-09-28 DIAGNOSIS — L821 Other seborrheic keratosis: Secondary | ICD-10-CM | POA: Diagnosis not present

## 2021-09-28 DIAGNOSIS — L814 Other melanin hyperpigmentation: Secondary | ICD-10-CM | POA: Diagnosis not present

## 2021-10-18 DIAGNOSIS — M9905 Segmental and somatic dysfunction of pelvic region: Secondary | ICD-10-CM | POA: Diagnosis not present

## 2021-10-18 DIAGNOSIS — M9903 Segmental and somatic dysfunction of lumbar region: Secondary | ICD-10-CM | POA: Diagnosis not present

## 2021-10-18 DIAGNOSIS — M9906 Segmental and somatic dysfunction of lower extremity: Secondary | ICD-10-CM | POA: Diagnosis not present

## 2021-10-18 DIAGNOSIS — M25552 Pain in left hip: Secondary | ICD-10-CM | POA: Diagnosis not present

## 2021-10-18 DIAGNOSIS — M48062 Spinal stenosis, lumbar region with neurogenic claudication: Secondary | ICD-10-CM | POA: Diagnosis not present

## 2021-10-18 DIAGNOSIS — M25551 Pain in right hip: Secondary | ICD-10-CM | POA: Diagnosis not present

## 2021-11-18 DIAGNOSIS — M9903 Segmental and somatic dysfunction of lumbar region: Secondary | ICD-10-CM | POA: Diagnosis not present

## 2021-11-18 DIAGNOSIS — M25551 Pain in right hip: Secondary | ICD-10-CM | POA: Diagnosis not present

## 2021-11-18 DIAGNOSIS — M9905 Segmental and somatic dysfunction of pelvic region: Secondary | ICD-10-CM | POA: Diagnosis not present

## 2021-11-18 DIAGNOSIS — M9904 Segmental and somatic dysfunction of sacral region: Secondary | ICD-10-CM | POA: Diagnosis not present

## 2021-11-18 DIAGNOSIS — M25552 Pain in left hip: Secondary | ICD-10-CM | POA: Diagnosis not present

## 2021-11-18 DIAGNOSIS — M9906 Segmental and somatic dysfunction of lower extremity: Secondary | ICD-10-CM | POA: Diagnosis not present

## 2021-11-18 DIAGNOSIS — M48062 Spinal stenosis, lumbar region with neurogenic claudication: Secondary | ICD-10-CM | POA: Diagnosis not present

## 2021-11-28 ENCOUNTER — Other Ambulatory Visit: Payer: Self-pay

## 2021-11-28 ENCOUNTER — Ambulatory Visit
Admission: RE | Admit: 2021-11-28 | Discharge: 2021-11-28 | Disposition: A | Discharge status: Home / Self Care | Payer: Medicare HMO | Source: Ambulatory Visit | Attending: Sports Medicine | Admitting: Sports Medicine

## 2021-11-28 ENCOUNTER — Other Ambulatory Visit: Payer: Self-pay | Admitting: Sports Medicine

## 2021-11-28 DIAGNOSIS — M25552 Pain in left hip: Secondary | ICD-10-CM

## 2021-11-28 DIAGNOSIS — M4807 Spinal stenosis, lumbosacral region: Secondary | ICD-10-CM

## 2021-11-28 DIAGNOSIS — M545 Low back pain, unspecified: Secondary | ICD-10-CM | POA: Diagnosis not present

## 2021-11-28 DIAGNOSIS — M2578 Osteophyte, vertebrae: Secondary | ICD-10-CM | POA: Diagnosis not present

## 2021-12-16 DIAGNOSIS — M9906 Segmental and somatic dysfunction of lower extremity: Secondary | ICD-10-CM | POA: Diagnosis not present

## 2021-12-16 DIAGNOSIS — M9904 Segmental and somatic dysfunction of sacral region: Secondary | ICD-10-CM | POA: Diagnosis not present

## 2021-12-16 DIAGNOSIS — M9903 Segmental and somatic dysfunction of lumbar region: Secondary | ICD-10-CM | POA: Diagnosis not present

## 2021-12-16 DIAGNOSIS — M25552 Pain in left hip: Secondary | ICD-10-CM | POA: Diagnosis not present

## 2021-12-16 DIAGNOSIS — M48062 Spinal stenosis, lumbar region with neurogenic claudication: Secondary | ICD-10-CM | POA: Diagnosis not present

## 2021-12-16 DIAGNOSIS — M9905 Segmental and somatic dysfunction of pelvic region: Secondary | ICD-10-CM | POA: Diagnosis not present

## 2021-12-22 ENCOUNTER — Encounter: Payer: Self-pay | Admitting: Gastroenterology

## 2022-01-04 ENCOUNTER — Ambulatory Visit (AMBULATORY_SURGERY_CENTER): Payer: Medicare HMO

## 2022-01-04 ENCOUNTER — Other Ambulatory Visit: Payer: Self-pay

## 2022-01-04 VITALS — Ht 68.0 in | Wt 210.0 lb

## 2022-01-04 DIAGNOSIS — Z1211 Encounter for screening for malignant neoplasm of colon: Secondary | ICD-10-CM

## 2022-01-04 NOTE — Progress Notes (Signed)
No egg or soy allergy known to patient  ?No issues known to pt with past sedation with any surgeries or procedures---other than PONV with all procedures ?Patient denies ever being told they had issues or difficulty with intubation  ?No FH of Malignant Hyperthermia ?Pt is not on diet pills ?Pt is not on home 02  ?Pt is not on blood thinners  ?Pt denies issues with constipation  ?No A fib or A flutter ?Pt is fully vaccinated for Covid x 2; ?NO PA's for preps discussed with pt in PV today  ?Discussed with pt there will be an out-of-pocket cost for prep and that varies from $0 to 70 + dollars - pt verbalized understanding  ?Due to the COVID-19 pandemic we are asking patients to follow certain guidelines in PV and the [facility]   ?Pt aware of COVID protocols and [facility] guidelines  ?PV completed over the phone. Pt verified name, DOB, address and insurance during PV today.  ?Pt mailed instruction packet with copy of consent form to read and not return, and instructions.  ?Pt encouraged to call with questions or issues.  ?If pt has My chart, procedure instructions sent via My Chart  ? ?

## 2022-01-16 ENCOUNTER — Encounter: Payer: Self-pay | Admitting: Gastroenterology

## 2022-01-17 ENCOUNTER — Encounter: Payer: Self-pay | Admitting: Certified Registered Nurse Anesthetist

## 2022-01-23 ENCOUNTER — Other Ambulatory Visit: Payer: Self-pay | Admitting: Internal Medicine

## 2022-01-24 ENCOUNTER — Encounter: Payer: Self-pay | Admitting: Certified Registered Nurse Anesthetist

## 2022-01-25 ENCOUNTER — Ambulatory Visit (AMBULATORY_SURGERY_CENTER): Payer: Medicare HMO | Admitting: Gastroenterology

## 2022-01-25 ENCOUNTER — Encounter: Payer: Self-pay | Admitting: Gastroenterology

## 2022-01-25 VITALS — BP 121/73 | HR 57 | Temp 96.6°F | Resp 19 | Ht 68.0 in | Wt 210.0 lb

## 2022-01-25 DIAGNOSIS — K635 Polyp of colon: Secondary | ICD-10-CM | POA: Diagnosis not present

## 2022-01-25 DIAGNOSIS — Z1211 Encounter for screening for malignant neoplasm of colon: Secondary | ICD-10-CM | POA: Diagnosis not present

## 2022-01-25 DIAGNOSIS — N183 Chronic kidney disease, stage 3 unspecified: Secondary | ICD-10-CM | POA: Diagnosis not present

## 2022-01-25 DIAGNOSIS — D120 Benign neoplasm of cecum: Secondary | ICD-10-CM | POA: Diagnosis not present

## 2022-01-25 DIAGNOSIS — E7800 Pure hypercholesterolemia, unspecified: Secondary | ICD-10-CM | POA: Diagnosis not present

## 2022-01-25 MED ORDER — SODIUM CHLORIDE 0.9 % IV SOLN: 500.0000 mL | Freq: Once | INTRAVENOUS | Status: DC

## 2022-01-25 NOTE — Progress Notes (Signed)
[facility] History and Physical ? ? ?Primary Care Physician:  Elby Showers, MD ? ? ?Reason for Procedure:   Colorectal cancer screening ? ?Plan:     colonoscopy ? ? ? ? ?HPI: Jacob Le is a 71 y.o. male  ?No nausea, vomiting, heartburn, regurgitation, odynophagia or dysphagia.  No significant diarrhea or constipation.  No melena or hematochezia. No unintentional weight loss. No abdominal pain. ? ? ?Past Medical History:  ?Diagnosis Date  ? Chronic kidney disease   ? stage 3  ? Dysrhythmia   ? 2nd degree heart block, Type 1  ? H/O nasal septoplasty 1996  ? Hyperlipidemia   ? on meds  ? PONV (postoperative nausea and vomiting)   ? Sleep apnea   ? without CPAP  ? Squamous cell cancer of external ear, left 2018  ? ? ?Past Surgical History:  ?Procedure Laterality Date  ? broken left shoulder    ? no surgerical intervention  ? CHOLECYSTECTOMY    ? COLONOSCOPY  2012  ? In [place]  ? KNEE ARTHROSCOPY W/ ACL RECONSTRUCTION Left 1990  ? LEFT HEART CATH AND CORONARY ANGIOGRAPHY N/A 12/18/2017  ? Procedure: LEFT HEART CATH AND CORONARY ANGIOGRAPHY;  Surgeon: Burnell Blanks, MD;  Location: [facility];  Service: Cardiovascular;  Laterality: N/A;  ? MENISCUS REPAIR Right 1992  ? pancreatis attack    ? pericarditis    ? ROTATOR CUFF REPAIR Right 2014  ? ? ?Prior to Admission medications   ?Medication Sig Start Date End Date Taking? Authorizing Provider  ?co-enzyme Q-10 30 MG capsule Take 30 mg by mouth daily.   Yes [provider]  ?rosuvastatin (CRESTOR) 5 MG tablet TAKE 1 TABLET BY MOUTH EVERY DAY 01/23/22  Yes Baxley, Cresenciano Lick, MD  ?albuterol (VENTOLIN HFA) 108 (90 Base) MCG/ACT inhaler Inhale 2 puffs into the lungs every 6 (six) hours as needed for wheezing or shortness of breath. 02/13/20   Elby Showers, MD  ?sildenafil (VIAGRA) 100 MG tablet Take 0.5-1 tablets (50-100 mg total) by mouth daily as needed for erectile dysfunction. ?Patient not taking: Reported on 01/04/2022 05/31/20    Elby Showers, MD  ? ? ?Current Outpatient Medications  ?Medication Sig Dispense Refill  ? co-enzyme Q-10 30 MG capsule Take 30 mg by mouth daily.    ? rosuvastatin (CRESTOR) 5 MG tablet TAKE 1 TABLET BY MOUTH EVERY DAY 90 tablet 1  ? albuterol (VENTOLIN HFA) 108 (90 Base) MCG/ACT inhaler Inhale 2 puffs into the lungs every 6 (six) hours as needed for wheezing or shortness of breath. 6.7 g 1  ? sildenafil (VIAGRA) 100 MG tablet Take 0.5-1 tablets (50-100 mg total) by mouth daily as needed for erectile dysfunction. (Patient not taking: Reported on 01/04/2022) 5 tablet 11  ? ?Current Facility-Administered Medications  ?Medication Dose Route Frequency Provider Last Rate Last Admin  ? 0.9 %  sodium chloride infusion  500 mL Intravenous Once Jackquline Denmark, MD      ? ? ?Allergies as of 01/25/2022 - Review Complete 01/25/2022  ?Allergen Reaction Noted  ? Codeine Nausea And Vomiting 11/28/2016  ? Other Nausea And Vomiting 06/11/2019  ? ? ?Family History  ?Problem Relation Age of Onset  ? Cancer Mother   ? Colon cancer Neg Hx   ? Colon polyps Neg Hx   ? Esophageal cancer Neg Hx   ? Prostate cancer Neg Hx   ? Rectal cancer Neg Hx   ? Stomach cancer Neg Hx   ? ? ?  Social History  ? ?Socioeconomic History  ? Marital status: Married  ?  Spouse name: Not on file  ? Number of children: Not on file  ? Years of education: Not on file  ? Highest education level: Not on file  ?Occupational History  ? Not on file  ?Tobacco Use  ? Smoking status: Never  ? Smokeless tobacco: Never  ?Vaping Use  ? Vaping Use: Never used  ?Substance and Sexual Activity  ? Alcohol use: Not Currently  ?  Alcohol/week: 0.0 - 4.0 standard drinks  ? Drug use: No  ? Sexual activity: Not on file  ?Other Topics Concern  ? Not on file  ?Social History Narrative  ? Not on file  ? ?Social Determinants of Health  ? ?Financial Resource Strain: Not on file  ?Food Insecurity: Not on file  ?Transportation Needs: Not on file  ?Physical Activity: Not on file  ?Stress: Not on  file  ?Social Connections: Not on file  ?Intimate Partner Violence: Not on file  ? ? ?Review of Systems: ?Positive for  none ?All other review of systems negative except as mentioned in the HPI. ? ?Physical Exam: ?Vital signs in last 24 hours: ?'@VSRANGES'$ @ ?  ?General:   Alert,  Well-developed, well-nourished, pleasant and cooperative in NAD ?Lungs:  Clear throughout to auscultation.   ?Heart:  Regular rate and rhythm; no murmurs, clicks, rubs,  or gallops. ?Abdomen:  Soft, nontender and nondistended. Normal bowel sounds.   ?Neuro/Psych:  Alert and cooperative. Normal mood and affect. A and O x 3 ? ? ? ?No significant changes were identified.  The patient continues to be an appropriate candidate for the planned procedure and anesthesia. ? ? ?Carmell Austria, MD. ?[facility] ?01/25/2022 8:40 AM@ ? ?

## 2022-01-25 NOTE — Patient Instructions (Signed)

## 2022-01-25 NOTE — Progress Notes (Signed)
Called to room to assist during endoscopic procedure.  Patient ID and intended procedure confirmed with present staff. Received instructions for my participation in the procedure from the performing physician.  

## 2022-01-25 NOTE — Progress Notes (Signed)
Report given to PACU, vss 

## 2022-01-25 NOTE — Progress Notes (Signed)
Vitals-DT  Pt's states no medical or surgical changes since previsit or office visit.  

## 2022-01-25 NOTE — Op Note (Signed)
[facility] ?Patient Name: Jacob Le ?Procedure Date: 01/25/2022 8:30 AM ?MRN: [id number] ?Endoscopist: Jackquline Denmark , MD ?Age: 71 ?Referring MD:  ?Date of Birth: 1950-11-14 ?Gender: Male ?Account #: [id number] ?Procedure:                Colonoscopy ?Indications:              Screening for colorectal malignant neoplasm ?Medicines:                Monitored Anesthesia Care ?Procedure:                Pre-Anesthesia Assessment: ?                          - Prior to the procedure, a History and Physical  ?                          was performed, and patient medications and  ?                          allergies were reviewed. The patient's tolerance of  ?                          previous anesthesia was also reviewed. The risks  ?                          and benefits of the procedure and the sedation  ?                          options and risks were discussed with the patient.  ?                          All questions were answered, and informed consent  ?                          was obtained. Prior Anticoagulants: The patient has  ?                          taken no previous anticoagulant or antiplatelet  ?                          agents. ASA Grade Assessment: II - A patient with  ?                          mild systemic disease. After reviewing the risks  ?                          and benefits, the patient was deemed in  ?                          satisfactory condition to undergo the procedure. ?                          After obtaining informed consent, the colonoscope  ?  was passed under direct vision. Throughout the  ?                          procedure, the patient's blood pressure, pulse, and  ?                          oxygen saturations were monitored continuously. The  ?                          CF [id number] #[id number] was introduced through the anus  ?                          and advanced to the the cecum, identified by  ?                          appendiceal orifice and  ileocecal valve. The  ?                          colonoscopy was performed without difficulty. The  ?                          patient tolerated the procedure well. The quality  ?                          of the bowel preparation was good. The ileocecal  ?                          valve, appendiceal orifice, and rectum were  ?                          photographed. ?Scope In: 8:49:53 AM ?Scope Out: 9:01:46 AM ?Scope Withdrawal Time: 0 hours 8 minutes 53 seconds  ?Total Procedure Duration: 0 hours 11 minutes 53 seconds  ?Findings:                 A 6 mm polyp was found in the cecum. The polyp was  ?                          sessile. The polyp was removed with a cold snare.  ?                          Resection and retrieval were complete. ?                          Multiple medium-mouthed diverticula were found in  ?                          the sigmoid colon and few in descending colon. One  ?                          large diverticulum noted at rectosigmoid junction. ?                          Non-bleeding internal hemorrhoids were found during  ?  retroflexion. The hemorrhoids were moderate and  ?                          Grade I (internal hemorrhoids that do not prolapse). ?                          The exam was otherwise without abnormality on  ?                          direct and retroflexion views. ?Complications:            No immediate complications. ?Estimated Blood Loss:     Estimated blood loss: none. ?Impression:               - One 6 mm polyp in the cecum, removed with a cold  ?                          snare. Resected and retrieved. ?                          - Moderate predominantly sigmoid diverticulosis. ?                          - Non-bleeding internal hemorrhoids. ?                          - The examination was otherwise normal on direct  ?                          and retroflexion views. ?Recommendation:           - Patient has a contact number available for  ?                           emergencies. The signs and symptoms of potential  ?                          delayed complications were discussed with the  ?                          patient. Return to normal activities tomorrow.  ?                          Written discharge instructions were provided to the  ?                          patient. ?                          - High fiber diet. ?                          - Continue present medications. ?                          - Await pathology results. ?                          -  Repeat colonoscopy for surveillance based on  ?                          pathology results. (Likely not needed d/t age) ?                          - The findings and recommendations were discussed  ?                          with the patient's family. ?Jackquline Denmark, MD ?01/25/2022 9:05:38 AM ?This report has been signed electronically. ?

## 2022-01-27 ENCOUNTER — Telehealth: Payer: Self-pay | Admitting: *Deleted

## 2022-01-27 ENCOUNTER — Telehealth: Payer: Self-pay

## 2022-01-27 NOTE — Telephone Encounter (Signed)
No answer, left message to call back later today, B.Latysha Thackston RN. 

## 2022-01-27 NOTE — Telephone Encounter (Signed)
Second follow up call attempt.  LVM ?

## 2022-01-29 ENCOUNTER — Encounter: Payer: Self-pay | Admitting: Gastroenterology

## 2022-02-27 ENCOUNTER — Emergency Department (HOSPITAL_BASED_OUTPATIENT_CLINIC_OR_DEPARTMENT_OTHER)
Admission: EM | Admit: 2022-02-27 | Discharge: 2022-02-27 | Disposition: A | Discharge status: Home / Self Care | Payer: Medicare HMO | Attending: Emergency Medicine | Admitting: Emergency Medicine

## 2022-02-27 ENCOUNTER — Emergency Department (HOSPITAL_BASED_OUTPATIENT_CLINIC_OR_DEPARTMENT_OTHER): Payer: Medicare HMO

## 2022-02-27 ENCOUNTER — Other Ambulatory Visit: Payer: Self-pay

## 2022-02-27 ENCOUNTER — Encounter (HOSPITAL_BASED_OUTPATIENT_CLINIC_OR_DEPARTMENT_OTHER): Payer: Self-pay | Admitting: Obstetrics and Gynecology

## 2022-02-27 DIAGNOSIS — R110 Nausea: Secondary | ICD-10-CM

## 2022-02-27 DIAGNOSIS — R1010 Upper abdominal pain, unspecified: Secondary | ICD-10-CM | POA: Diagnosis not present

## 2022-02-27 DIAGNOSIS — K859 Acute pancreatitis without necrosis or infection, unspecified: Secondary | ICD-10-CM | POA: Diagnosis not present

## 2022-02-27 DIAGNOSIS — R109 Unspecified abdominal pain: Secondary | ICD-10-CM | POA: Diagnosis not present

## 2022-02-27 DIAGNOSIS — R1012 Left upper quadrant pain: Secondary | ICD-10-CM | POA: Diagnosis not present

## 2022-02-27 LAB — CBC WITH DIFFERENTIAL/PLATELET
Abs Immature Granulocytes: 0.03 10*3/uL (ref 0.00–0.07)
Basophils Absolute: 0 10*3/uL (ref 0.0–0.1)
Basophils Relative: 0 %
Eosinophils Absolute: 0.1 10*3/uL (ref 0.0–0.5)
Eosinophils Relative: 1 %
HCT: 42.6 % (ref 39.0–52.0)
Hemoglobin: 14.9 g/dL (ref 13.0–17.0)
Immature Granulocytes: 0 %
Lymphocytes Relative: 17 %
Lymphs Abs: 2 10*3/uL (ref 0.7–4.0)
MCH: 30.5 pg (ref 26.0–34.0)
MCHC: 35 g/dL (ref 30.0–36.0)
MCV: 87.3 fL (ref 80.0–100.0)
Monocytes Absolute: 1.1 10*3/uL — ABNORMAL HIGH (ref 0.1–1.0)
Monocytes Relative: 10 %
Neutro Abs: 8.5 10*3/uL — ABNORMAL HIGH (ref 1.7–7.7)
Neutrophils Relative %: 72 %
Platelets: 238 10*3/uL (ref 150–400)
RBC: 4.88 MIL/uL (ref 4.22–5.81)
RDW: 12.7 % (ref 11.5–15.5)
WBC: 11.8 10*3/uL — ABNORMAL HIGH (ref 4.0–10.5)
nRBC: 0 % (ref 0.0–0.2)

## 2022-02-27 LAB — URINALYSIS, ROUTINE W REFLEX MICROSCOPIC
Bilirubin Urine: NEGATIVE
Glucose, UA: NEGATIVE mg/dL
Hgb urine dipstick: NEGATIVE
Ketones, ur: NEGATIVE mg/dL
Leukocytes,Ua: NEGATIVE
Nitrite: NEGATIVE
Protein, ur: NEGATIVE mg/dL
Specific Gravity, Urine: 1.005 (ref 1.005–1.030)
pH: 5.5 (ref 5.0–8.0)

## 2022-02-27 LAB — COMPREHENSIVE METABOLIC PANEL
ALT: 134 U/L — ABNORMAL HIGH (ref 0–44)
AST: 75 U/L — ABNORMAL HIGH (ref 15–41)
Albumin: 4.6 g/dL (ref 3.5–5.0)
Alkaline Phosphatase: 86 U/L (ref 38–126)
Anion gap: 12 (ref 5–15)
BUN: 14 mg/dL (ref 8–23)
CO2: 22 mmol/L (ref 22–32)
Calcium: 9.8 mg/dL (ref 8.9–10.3)
Chloride: 103 mmol/L (ref 98–111)
Creatinine, Ser: 1.09 mg/dL (ref 0.61–1.24)
GFR, Estimated: >60 mL/min (ref >60)
Glucose, Bld: 128 mg/dL — ABNORMAL HIGH (ref 70–99)
Potassium: 4.2 mmol/L (ref 3.5–5.1)
Sodium: 137 mmol/L (ref 135–145)
Total Bilirubin: 2.8 mg/dL — ABNORMAL HIGH (ref 0.3–1.2)
Total Protein: 7.7 g/dL (ref 6.5–8.1)

## 2022-02-27 LAB — LIPASE, BLOOD: Lipase: 94 U/L — ABNORMAL HIGH (ref 11–51)

## 2022-02-27 MED ORDER — HYDROCODONE-ACETAMINOPHEN 5-325 MG PO TABS: 1.0000 | ORAL_TABLET | Freq: Four times a day (QID) | ORAL | 0 refills | Status: DC | PRN

## 2022-02-27 MED ORDER — ONDANSETRON 4 MG PO TBDP: 4.0000 mg | ORAL_TABLET | Freq: Three times a day (TID) | ORAL | 0 refills | Status: DC | PRN

## 2022-02-27 MED ORDER — IOHEXOL 300 MG/ML  SOLN
100.0000 mL | Freq: Once | INTRAMUSCULAR | Status: AC | PRN
  Administered 2022-02-27: 80 mL via INTRAVENOUS

## 2022-02-27 NOTE — ED Provider Notes (Signed)
?[facility] ?Provider Note ? ? ?CSN: [id number] ?Arrival date & time: 02/27/22  9476 ? ?  ? ?History ?Chief Complaint  ?Patient presents with  ? Abdominal Pain  ? ? ?Jacob Le is a 71 y.o. male with history of pancreatitis who presents to the emergency department today with a 3-day history of waxing and waning upper abdominal pain that he describes as a dull ache.  Patient denies vomiting, diarrhea, fever, chills, cough, congestion, chest pain.  Patient states that he has had pancreatitis several times in the past and was thought to due to his gallbladder which was removed roughly 10 years ago.  Patient has had a couple bouts of pancreatitis since then.  Patient drinks 3-4 times per week.  No urinary complaints. ? ?HPI ? ?  ? ?Home Medications ?Prior to Admission medications   ?Medication Sig Start Date End Date Taking? Authorizing Provider  ?HYDROcodone-acetaminophen (NORCO/VICODIN) 5-325 MG tablet Take 1-2 tablets by mouth every 6 (six) hours as needed. 02/27/22  Yes Raul Del, Anais Koenen M, PA-C  ?ondansetron (ZOFRAN-ODT) 4 MG disintegrating tablet Take 1 tablet (4 mg total) by mouth every 8 (eight) hours as needed for nausea or vomiting. 02/27/22  Yes Myna Bright M, PA-C  ?albuterol (VENTOLIN HFA) 108 (90 Base) MCG/ACT inhaler Inhale 2 puffs into the lungs every 6 (six) hours as needed for wheezing or shortness of breath. 02/13/20   Elby Showers, MD  ?co-enzyme Q-10 30 MG capsule Take 30 mg by mouth daily.    [provider]  ?rosuvastatin (CRESTOR) 5 MG tablet TAKE 1 TABLET BY MOUTH EVERY DAY 01/23/22   Elby Showers, MD  ?sildenafil (VIAGRA) 100 MG tablet Take 0.5-1 tablets (50-100 mg total) by mouth daily as needed for erectile dysfunction. ?Patient not taking: Reported on 01/04/2022 05/31/20   Elby Showers, MD  ?   ? ?Allergies    ?Codeine and Other   ? ?Review of Systems   ?Review of Systems  ?All other systems reviewed and are negative. ? ?Physical Exam ?Updated Vital  Signs ?BP (!) 172/80   Pulse 86   Temp 98.4 ?F (36.9 ?C) (Oral)   Resp 17   Ht '5\' 8"'$  (1.727 m)   Wt 95.3 kg   SpO2 97%   BMI 31.93 kg/m?  ?Physical Exam ?Vitals and nursing note reviewed.  ?Constitutional:   ?   General: He is not in acute distress. ?   Appearance: Normal appearance.  ?HENT:  ?   Head: Normocephalic and atraumatic.  ?Eyes:  ?   General:     ?   Right eye: No discharge.     ?   Left eye: No discharge.  ?Cardiovascular:  ?   Comments: Regular rate and rhythm.  S1/S2 are distinct without any evidence of murmur, rubs, or gallops.  Radial pulses are 2+ bilaterally.  Dorsalis pedis pulses are 2+ bilaterally.  No evidence of pedal edema. ?Pulmonary:  ?   Comments: Clear to auscultation bilaterally.  Normal effort.  No respiratory distress.  No evidence of wheezes, rales, or rhonchi heard throughout. ?Abdominal:  ?   General: Abdomen is flat. Bowel sounds are normal. There is no distension.  ?   Tenderness: There is abdominal tenderness in the epigastric area and left upper quadrant. There is no guarding or rebound.  ?Musculoskeletal:     ?   General: Normal range of motion.  ?   Cervical back: Neck supple.  ?Skin: ?   General: Skin is  warm and dry.  ?   Findings: No rash.  ?Neurological:  ?   General: No focal deficit present.  ?   Mental Status: He is alert.  ?Psychiatric:     ?   Mood and Affect: Mood normal.     ?   Behavior: Behavior normal.  ? ? ?ED Results / Procedures / Treatments   ?Labs ?(all labs ordered are listed, but only abnormal results are displayed) ?Labs Reviewed  ?CBC WITH DIFFERENTIAL/PLATELET - Abnormal; Notable for the following components:  ?    Result Value  ? WBC 11.8 (*)   ? Neutro Abs 8.5 (*)   ? Monocytes Absolute 1.1 (*)   ? All other components within normal limits  ?COMPREHENSIVE METABOLIC PANEL - Abnormal; Notable for the following components:  ? Glucose, Bld 128 (*)   ? AST 75 (*)   ? ALT 134 (*)   ? Total Bilirubin 2.8 (*)   ? All other components within normal  limits  ?LIPASE, BLOOD - Abnormal; Notable for the following components:  ? Lipase 94 (*)   ? All other components within normal limits  ?URINALYSIS, ROUTINE W REFLEX MICROSCOPIC  ? ? ?EKG ?None ? ?Radiology ?CT ABDOMEN PELVIS W CONTRAST ? ?Result Date: 02/27/2022 ?CLINICAL DATA:  Abdominal pain, acute, nonlocalized diffuse abdominal pain EXAM: CT ABDOMEN AND PELVIS WITH CONTRAST TECHNIQUE: Multidetector CT imaging of the abdomen and pelvis was performed using the standard protocol following bolus administration of intravenous contrast. RADIATION DOSE REDUCTION: This exam was performed according to the departmental dose-optimization program which includes automated exposure control, adjustment of the mA and/or kV according to patient size and/or use of iterative reconstruction technique. CONTRAST:  24m OMNIPAQUE IOHEXOL 300 MG/ML  SOLN COMPARISON:  06/10/2019 FINDINGS: Lower chest: No acute abnormality. Hepatobiliary: 8 mm low-density lesion within the right hepatic lobe, too small to characterize. Prior cholecystectomy with mild intra and extrahepatic biliary dilatation. Pancreas: Pancreatic tail is edematous with peripancreatic fat stranding and trace fluid. No organized or walled off peripancreatic fluid collections. No convincing evidence of parenchymal necrosis. No ductal dilatation. Spleen: Normal in size without focal abnormality. Adrenals/Urinary Tract: Unremarkable adrenal glands. Kidneys enhance symmetrically without focal lesion, stone, or hydronephrosis. Ureters are nondilated. Urinary bladder appears unremarkable. Stomach/Bowel: Small hiatal hernia. Stomach is otherwise within normal limits. Appendix appears normal (series 2, image 48). Diverticulosis is most pronounced at the sigmoid colon. No evidence of bowel wall thickening, distention, or inflammatory changes. Vascular/Lymphatic: Scattered aortoiliac atherosclerotic calcifications without aneurysm. No abdominopelvic lymphadenopathy. Reproductive:  Mildly enlarged prostate gland with dystrophic calcifications. Other: Fat containing umbilical and bilateral inguinal hernias, left greater than right. No pneumoperitoneum. Musculoskeletal: No acute or significant osseous findings. Multilevel lumbar spondylosis. IMPRESSION: 1. Acute pancreatitis involving the pancreatic tail. 2. Colonic diverticulosis without evidence of acute diverticulitis. 3. Fat containing umbilical and bilateral inguinal hernias. 4. Aortic Atherosclerosis (ICD10-I70.0). Electronically Signed   By: NDavina PokeD.O.   On: 02/27/2022 11:45   ? ?Procedures ?Procedures  ? ? ?Medications Ordered in ED ?Medications  ?iohexol (OMNIPAQUE) 300 MG/ML solution 100 mL (80 mLs Intravenous Contrast Given 02/27/22 1128)  ? ? ?ED Course/ Medical Decision Making/ A&P ?Clinical Course as of 02/27/22 1231  ?Mon Feb 27, 2022  ?1016 WBC(!): 11.8 ?Noted to be elevated.  This is up from previous results as well. [CF]  ?1018 Urinalysis, Routine w reflex microscopic Urine, Clean Catch ?Urinalysis does not show any signs of infection at this given point.  No clinical signs of  dehydration either. [CF]  ?1138 Lipase(!): 94 ?Lipase elevated but it does not meet criteria for pancreatitis is now 3 times upper normal limit. [CF]  ?1138 Comprehensive metabolic panel(!) ?Electrolytes appear normal.  There is evidence of transaminitis and increase in bilirubin. [CF]  ?1221 Comprehensive metabolic panel(!) [CF]  ?  ?Clinical Course User Index ?[CF] Myna Bright M, PA-C  ? ?                        ?Medical Decision Making ?Quantae Martel is a 71 y.o. male who presents to the emergency department today with 3-day history of waxing and waning upper abdominal pain.  Differential diagnosis includes pancreatitis, GERD, hepatobiliary disease, appendicitis although less likely considering his belly pain is more upper. ? ?Amount and/or Complexity of Data Reviewed ?External Data Reviewed: labs, radiology and notes. ?   Details:  Patient had a colonoscopy done back in April which showed multiple moderate size diverticula.  There was also evidence of polyps that were removed via cold snare.  Patient also did have nonbleeding internal hemorrhoi

## 2022-02-27 NOTE — ED Triage Notes (Signed)
Patient reports to the ER for peri-umbilical pain. Patient reports he has a hx of pancreatitis and this feels very similar. Denies hx of Diverticulitis, denies emesis or diarrhea but reports significant nausea. Patient reports he took Vicodin for the pain yesterday that seemed to help the pain slightly but increased the nausea.  ?

## 2022-02-27 NOTE — ED Notes (Signed)
Patient transported to CT 

## 2022-02-27 NOTE — ED Notes (Signed)
Discharge instructions, follow up care, and prescriptions reviewed and explained, pt verbalized understanding.  

## 2022-02-27 NOTE — Discharge Instructions (Signed)
Please give your primary care doctor call and schedule repeat labs to ensure resolution of your elevated liver enzymes and pancreatic enzymes.  I sent pain medication in nausea medication to your pharmacy.  Please take as prescribed.  Please return to the emergency department for any worsening symptoms you might have. ?

## 2022-02-28 ENCOUNTER — Telehealth: Payer: Self-pay | Admitting: Internal Medicine

## 2022-02-28 ENCOUNTER — Ambulatory Visit (INDEPENDENT_AMBULATORY_CARE_PROVIDER_SITE_OTHER): Payer: Medicare HMO | Admitting: Internal Medicine

## 2022-02-28 ENCOUNTER — Telehealth: Payer: Self-pay | Admitting: Gastroenterology

## 2022-02-28 ENCOUNTER — Encounter: Payer: Self-pay | Admitting: Internal Medicine

## 2022-02-28 VITALS — BP 138/88 | HR 63 | Temp 98.5°F | Ht 68.0 in | Wt 212.5 lb

## 2022-02-28 DIAGNOSIS — Z8679 Personal history of other diseases of the circulatory system: Secondary | ICD-10-CM

## 2022-02-28 DIAGNOSIS — K859 Acute pancreatitis without necrosis or infection, unspecified: Secondary | ICD-10-CM | POA: Diagnosis not present

## 2022-02-28 DIAGNOSIS — Z9049 Acquired absence of other specified parts of digestive tract: Secondary | ICD-10-CM | POA: Diagnosis not present

## 2022-02-28 DIAGNOSIS — E7800 Pure hypercholesterolemia, unspecified: Secondary | ICD-10-CM | POA: Diagnosis not present

## 2022-02-28 DIAGNOSIS — N1831 Chronic kidney disease, stage 3a: Secondary | ICD-10-CM | POA: Diagnosis not present

## 2022-02-28 NOTE — Patient Instructions (Addendum)
Stay with clear liquids a few days and advance diet slowly.  May take Zofran if needed for nausea.  Try to avoid pain medication if possible.  Referral will be made to [facility] for evaluation of recurrent bouts of pancreatitis.  Avoid fatty foods in the next several weeks.  Avoid alcohol if at all possible.  Stay well-hydrated.  We will schedule Medicare wellness visit for later this year.  Return in a couple of weeks to follow-up on abnormal labs from bout of pancreatitis. ?

## 2022-02-28 NOTE — Telephone Encounter (Signed)
Patient was just seen at the hospital for Acute pancreatitis seeking an appt sooner than later please let me know if Dr. Lyndel Safe has anything available. ?

## 2022-02-28 NOTE — Telephone Encounter (Signed)
Left message for pt to call back  °
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
## 2022-02-28 NOTE — Telephone Encounter (Signed)
Called [facility] to schedule appointment for Hospital FU for patient because he was seen for recurrent pancreatitis. Spoke with scheduling, she is going to get with nurse and then call patient to schedule. They do not need referral for existing patient. ?

## 2022-03-01 NOTE — Telephone Encounter (Signed)
Scheduled an appointment for Jacob Le  ?

## 2022-03-01 NOTE — Telephone Encounter (Signed)
Left message for pt to come back  ?

## 2022-03-01 NOTE — Telephone Encounter (Signed)
Pt was contacted in regard to phone message that was received: ?Pt stated that he does not know why he needs an appointment: Chart reviewed: Pt notified of Dr. Renold Genta message: Pt stated that he will have another appt soon with Dr. Renold Genta and will find out why he needs an appointment and will notify our office:  ?Pt verbalized understanding with all questions answered.  ?  ?

## 2022-03-16 ENCOUNTER — Other Ambulatory Visit: Payer: Medicare HMO

## 2022-03-16 DIAGNOSIS — K859 Acute pancreatitis without necrosis or infection, unspecified: Secondary | ICD-10-CM

## 2022-03-17 LAB — COMPLETE METABOLIC PANEL WITH GFR
AG Ratio: 2.3 (calc) (ref 1.0–2.5)
ALT: 39 U/L (ref 9–46)
AST: 25 U/L (ref 10–35)
Albumin: 4.4 g/dL (ref 3.6–5.1)
Alkaline phosphatase (APISO): 64 U/L (ref 35–144)
BUN: 19 mg/dL (ref 7–25)
CO2: 23 mmol/L (ref 20–32)
Calcium: 9.5 mg/dL (ref 8.6–10.3)
Chloride: 107 mmol/L (ref 98–110)
Creat: 1.22 mg/dL (ref 0.70–1.28)
Globulin: 1.9 g/dL (calc) (ref 1.9–3.7)
Glucose, Bld: 98 mg/dL (ref 65–99)
Potassium: 4.9 mmol/L (ref 3.5–5.3)
Sodium: 139 mmol/L (ref 135–146)
Total Bilirubin: 1.2 mg/dL (ref 0.2–1.2)
Total Protein: 6.3 g/dL (ref 6.1–8.1)
eGFR: 63 mL/min/{1.73_m2} (ref >=60)

## 2022-03-17 LAB — CBC WITH DIFFERENTIAL/PLATELET
Absolute Monocytes: 485 cells/uL (ref 200–950)
Basophils Absolute: 29 cells/uL (ref 0–200)
Basophils Relative: 0.6 %
Eosinophils Absolute: 201 cells/uL (ref 15–500)
Eosinophils Relative: 4.1 %
HCT: 39.9 % (ref 38.5–50.0)
Hemoglobin: 13.8 g/dL (ref 13.2–17.1)
Lymphs Abs: 2176 cells/uL (ref 850–3900)
MCH: 31.4 pg (ref 27.0–33.0)
MCHC: 34.6 g/dL (ref 32.0–36.0)
MCV: 90.7 fL (ref 80.0–100.0)
MPV: 10.3 fL (ref 7.5–12.5)
Monocytes Relative: 9.9 %
Neutro Abs: 2009 cells/uL (ref 1500–7800)
Neutrophils Relative %: 41 %
Platelets: 256 10*3/uL (ref 140–400)
RBC: 4.4 10*6/uL (ref 4.20–5.80)
RDW: 12.8 % (ref 11.0–15.0)
Total Lymphocyte: 44.4 %
WBC: 4.9 10*3/uL (ref 3.8–10.8)

## 2022-03-17 LAB — AMYLASE: Amylase: 67 U/L (ref 21–101)

## 2022-03-17 LAB — LIPASE: Lipase: 28 U/L (ref 7–60)

## 2022-03-20 ENCOUNTER — Encounter: Payer: Self-pay | Admitting: Physician Assistant

## 2022-03-20 ENCOUNTER — Ambulatory Visit: Payer: Medicare HMO | Admitting: Physician Assistant

## 2022-03-20 VITALS — BP 124/76 | HR 57 | Ht 68.0 in | Wt 212.6 lb

## 2022-03-20 DIAGNOSIS — K859 Acute pancreatitis without necrosis or infection, unspecified: Secondary | ICD-10-CM

## 2022-03-20 NOTE — Progress Notes (Signed)
Agree with assessment/plan. MRCP has been scheduled. RG

## 2022-03-20 NOTE — Progress Notes (Signed)
Chief Complaint: Recurrent pancreatitis  HPI:    Jacob Le is a 71 year old male with a past medical history as listed below including CKD, previous cholecystectomy and recurrent pancreatitis, known to Dr. Lyndel Safe, who presents to clinic today to discuss his recurrent episodes of pancreatitis.    01/25/2022 colonoscopy with Dr. Lyndel Safe with one 6 mm polyp in the cecum, moderate predominantly sigmoid diverticulosis and nonbleeding internal hemorrhoids.  Pathology showed sessile serrated polyp and repeat was recommended in 5 years if patient was able.    02/27/2022 patient seen in the ED for upper abdominal pain.  Labs showed a white count 11.8, AST 75, ALT 134, total bili 2.8, lipase 94.  CT showed acute pancreatitis involving the pancreatic tail.  He was prescribed Zofran and Hydrocodone.    02/28/2022 patient saw PCP in regards to pancreatitis advised to stay on clear liquids for a few days and advance diet slowly.  Zofran as needed for nausea.    Today, the patient tells me that in 2011 he had 2 severe attacks of pancreatitis and they "search for a cause", but could not find anything so he eventually had his gallbladder out as this could have been a possibility.  He did not have an attack for years afterwards but within the last 4 years now he has had 3-4 episodes.  About 3 years ago he was seen in the ER for an episode and had to be given Morphine, but was then discharged.  Then his last was just a few weeks ago.  He tells me it lasted for 4 to 5 days and after some Oxycodone and got better and he feels okay now.  Tells me he did follow with [facility] gastroenterology but did not like their practice and they had recommended some further evaluation to see if he had any "sludge", he is now willing to proceed as he does not want this to happen again.  Does discuss that he is a moderate drinker and has 1-2 drinks at least 3 times a week.  He has not had any alcohol since his last episode and is willing to give it  up completely if that is what it takes.  Cannot tell that anything triggers it necessarily.  Typically able to do well with p.o. pain meds and drinking clear liquids for a few days.    Denies fever, chills, weight loss, change in bowel habits, nausea or vomiting.  Past Medical History:  Diagnosis Date   Chronic kidney disease    stage 3   Dysrhythmia    2nd degree heart block, Type 1   H/O nasal septoplasty 1996   Hyperlipidemia    on meds   PONV (postoperative nausea and vomiting)    Sleep apnea    without CPAP   Squamous cell cancer of external ear, left 2018    Past Surgical History:  Procedure Laterality Date   broken left shoulder     no surgerical intervention   CHOLECYSTECTOMY     COLONOSCOPY  2012   In [place]   KNEE ARTHROSCOPY W/ ACL RECONSTRUCTION Left 1990   LEFT HEART CATH AND CORONARY ANGIOGRAPHY N/A 12/18/2017   Procedure: LEFT HEART CATH AND CORONARY ANGIOGRAPHY;  Surgeon: Burnell Blanks, MD;  Location: [facility];  Service: Cardiovascular;  Laterality: N/A;   MENISCUS REPAIR Right 1992   pancreatis attack     pericarditis     ROTATOR CUFF REPAIR Right 2014    Current Outpatient Medications  Medication  Sig Dispense Refill   co-enzyme Q-10 30 MG capsule Take 30 mg by mouth daily.     rosuvastatin (CRESTOR) 5 MG tablet TAKE 1 TABLET BY MOUTH EVERY DAY 90 tablet 1   sildenafil (VIAGRA) 100 MG tablet Take 0.5-1 tablets (50-100 mg total) by mouth daily as needed for erectile dysfunction. 5 tablet 11   No current facility-administered medications for this visit.    Allergies as of 03/20/2022 - Review Complete 03/20/2022  Allergen Reaction Noted   Codeine Nausea And Vomiting 11/28/2016   Other Nausea And Vomiting 06/11/2019    Family History  Problem Relation Age of Onset   Cancer Mother    Colon cancer Neg Hx    Colon polyps Neg Hx    Esophageal cancer Neg Hx    Prostate cancer Neg Hx    Rectal cancer Neg Hx    Stomach cancer Neg Hx      Social History   Socioeconomic History   Marital status: Married    Spouse name: Not on file   Number of children: Not on file   Years of education: Not on file   Highest education level: Not on file  Occupational History   Not on file  Tobacco Use   Smoking status: Never   Smokeless tobacco: Never  Vaping Use   Vaping Use: Never used  Substance and Sexual Activity   Alcohol use: Not Currently    Alcohol/week: 0.0 - 4.0 standard drinks   Drug use: No   Sexual activity: Yes  Other Topics Concern   Not on file  Social History Narrative   Not on file   Social Determinants of Health   Financial Resource Strain: Not on file  Food Insecurity: Not on file  Transportation Needs: Not on file  Physical Activity: Not on file  Stress: Not on file  Social Connections: Not on file  Intimate Partner Violence: Not on file    Review of Systems:    Constitutional: No weight loss, fever or chills Cardiovascular: No chest pain  Respiratory: No SOB  Gastrointestinal: See HPI and otherwise negative   Physical Exam:  Vital signs: BP 124/76   Pulse (!) 57   Ht '5\' 8"'$  (1.727 m)   Wt 212 lb 9.6 oz (96.4 kg)   SpO2 97%   BMI 32.33 kg/m    Constitutional:   Pleasant Caucasian male appears to be in NAD, Well developed, Well nourished, alert and cooperative Respiratory: Respirations even and unlabored. Lungs clear to auscultation bilaterally.   No wheezes, crackles, or rhonchi.  Cardiovascular: Normal S1, S2. No MRG. Regular rate and rhythm. No peripheral edema, cyanosis or pallor.  Gastrointestinal:  Soft, nondistended, mild epigastric ttp. No rebound or guarding. Normal bowel sounds. No appreciable masses or hepatomegaly. Rectal:  Not performed.  Psychiatric:  Demonstrates good judgement and reason without abnormal affect or behaviors.  RELEVANT LABS AND IMAGING (and see HPI): CBC    Component Value Date/Time   WBC 4.9 03/16/2022 0954   RBC 4.40 03/16/2022 0954   HGB 13.8  03/16/2022 0954   HCT 39.9 03/16/2022 0954   PLT 256 03/16/2022 0954   MCV 90.7 03/16/2022 0954   MCH 31.4 03/16/2022 0954   MCHC 34.6 03/16/2022 0954   RDW 12.8 03/16/2022 0954   LYMPHSABS 2,176 03/16/2022 0954   MONOABS 1.1 (H) 02/27/2022 0943   EOSABS 201 03/16/2022 0954   BASOSABS 29 03/16/2022 0954    CMP     Component Value Date/Time  NA 139 03/16/2022 0954   K 4.9 03/16/2022 0954   CL 107 03/16/2022 0954   CO2 23 03/16/2022 0954   GLUCOSE 98 03/16/2022 0954   BUN 19 03/16/2022 0954   CREATININE 1.22 03/16/2022 0954   CALCIUM 9.5 03/16/2022 0954   PROT 6.3 03/16/2022 0954   PROT 7.4 04/10/2018 1217   ALBUMIN 4.6 02/27/2022 0943   ALBUMIN 4.9 (H) 04/10/2018 1217   AST 25 03/16/2022 0954   ALT 39 03/16/2022 0954   ALKPHOS 86 02/27/2022 0943   BILITOT 1.2 03/16/2022 0954   BILITOT 1.9 (H) 04/10/2018 1217   GFRNONAA >60 02/27/2022 0943   GFRNONAA 51 (L) 05/25/2020 1155   GFRAA 59 (L) 05/25/2020 1155    Assessment: 1.  Recurrent pancreatitis: Initial onset in 2011, status postcholecystectomy that year, 3-4 attacks now over the past 4 years requiring ER visits and p.o. pain meds, but no hospitalizations, does report weekly alcohol use, 1-2 drinks every 3 days or so; consider relation to microlithiasis versus alcohol versus other  Plan: 1.  Ordered an MRI/MRCP for further evaluation now.  If this is unrevealing then can consider EUS in the future. 2.  For now patient is aware that if he starts with abdominal pain he should back off to a clear liquid diet and try to get in a lot of fluids. 3.  Patient to return to clinic per recommendations after above.  Jacob Newer, PA-C [facility] 03/20/2022, 10:07 AM  Cc: Elby Showers, MD

## 2022-03-20 NOTE — Patient Instructions (Addendum)
You have been scheduled for an MRI/MRCP at _______ on _______. Your appointment time is ________. Please arrive to admitting (at main entrance of the hospital) 15 minutes prior to your appointment time for registration purposes. Please make certain not to have anything to eat or drink 6 hours prior to your test. In addition, if you have any metal in your body, have a pacemaker or defibrillator, please be sure to let your ordering physician know. This test typically takes 45 minutes to 1 hour to complete. Should you need to reschedule, please call [phone] to do so.   You will be contacted by [place] in the next 2 days to arrange a MRCP/MRI.  The number on your caller ID will be [phone], please answer when they call.  If you have not heard from them in 2 days please call [phone] to schedule.     You will follow up per recommendations after test   If you are age 91 or older, your body mass index should be between 23-30. Your Body mass index is 32.33 kg/m. If this is out of the aforementioned range listed, please consider follow up with your Primary Care Provider.  If you are age 49 or younger, your body mass index should be between 19-25. Your Body mass index is 32.33 kg/m. If this is out of the aformentioned range listed, please consider follow up with your Primary Care Provider.   ________________________________________________________  The [facility] providers would like to encourage you to use [facility] to communicate with providers for non-urgent requests or questions.  Due to long hold times on the telephone, sending your provider a message by [facility] may be a faster and more efficient way to get a response.  Please allow 48 business hours for a response.  Please remember that this is for non-urgent requests.  _______________________________________________________   Due to recent changes in healthcare laws, you may see the results of your imaging and  laboratory studies on MyChart before your provider has had a chance to review them.  We understand that in some cases there may be results that are confusing or concerning to you. Not all laboratory results come back in the same time frame and the provider may be waiting for multiple results in order to interpret others.  Please give [place] 48 hours in order for your provider to thoroughly review all the results before contacting the office for clarification of your results. .  I appreciate the  opportunity to care for you  Thank You   Jacob Le

## 2022-03-23 ENCOUNTER — Telehealth: Payer: Self-pay | Admitting: Physician Assistant

## 2022-03-23 NOTE — Telephone Encounter (Signed)
Lets give Ativan 1 mg p.o. x 1, half an hour before MR RG

## 2022-03-23 NOTE — Telephone Encounter (Signed)
Patient called to follow up on a sedative that is requesting to get for an MRI or CT appt.

## 2022-03-24 MED ORDER — LORAZEPAM 1 MG PO TABS: ORAL_TABLET | ORAL | 0 refills | Status: DC

## 2022-03-24 NOTE — Telephone Encounter (Signed)
Called [facility] pharmacy on file. Gave verbal order to [facility] for Ativan RX. Patient notified via [place].

## 2022-03-24 NOTE — Telephone Encounter (Signed)
This has been addressed. See 03/22/22 patient message for details.

## 2022-03-25 ENCOUNTER — Ambulatory Visit (HOSPITAL_COMMUNITY)
Admission: RE | Admit: 2022-03-25 | Discharge: 2022-03-25 | Disposition: A | Discharge status: Home / Self Care | Payer: Medicare HMO | Source: Ambulatory Visit | Attending: Physician Assistant | Admitting: Physician Assistant

## 2022-03-25 ENCOUNTER — Other Ambulatory Visit: Payer: Self-pay | Admitting: Physician Assistant

## 2022-03-25 DIAGNOSIS — K859 Acute pancreatitis without necrosis or infection, unspecified: Secondary | ICD-10-CM | POA: Insufficient documentation

## 2022-03-25 DIAGNOSIS — K7689 Other specified diseases of liver: Secondary | ICD-10-CM | POA: Diagnosis not present

## 2022-03-25 DIAGNOSIS — K8689 Other specified diseases of pancreas: Secondary | ICD-10-CM | POA: Diagnosis not present

## 2022-03-25 DIAGNOSIS — K863 Pseudocyst of pancreas: Secondary | ICD-10-CM | POA: Diagnosis not present

## 2022-03-25 MED ORDER — GADOBUTROL 1 MMOL/ML IV SOLN
10.0000 mL | Freq: Once | INTRAVENOUS | Status: AC | PRN
  Administered 2022-03-25: 10 mL via INTRAVENOUS

## 2022-04-04 ENCOUNTER — Telehealth: Payer: Self-pay

## 2022-04-04 ENCOUNTER — Other Ambulatory Visit: Payer: Self-pay

## 2022-04-04 DIAGNOSIS — K859 Acute pancreatitis without necrosis or infection, unspecified: Secondary | ICD-10-CM

## 2022-04-04 NOTE — Telephone Encounter (Signed)
-----   Message from Levin Erp, [place] sent at 04/04/2022  8:38 AM EDT ----- Regarding: FW: EUS Please call and see how patient feels about EUS and order additional labs.   Thanks-JLL ----- Message ----- From: Irving Copas., MD Sent: 04/03/2022  12:42 PM EDT To: Levin Erp, PA; Jackquline Denmark, MD Subject: RE: EUS                                        RG and JLL, Not unreasonable to perform EUS. Recommend patient have IgG4 and ANA sent as well (even if its been done within the last few years). If the patient is agreeable to this let me know and we can forward this to [facility] and she will get scheduled with DJ or myself. Thanks. GM ----- Message ----- From: Jackquline Denmark, MD Sent: 04/02/2022  12:04 PM EDT To: Levin Erp, PA; # Subject: EUS                                            I think he would benefit from EUS given recurrent pancreatitis without any definite etiology.  Lets run it by Dr. Dion Saucier, Nonurgent. Can you please review and see if Sam would benefit from EUS Thanks as always RG   ----- Message ----- From: Levin Erp, PA Sent: 03/27/2022   3:47 PM EDT To: Jackquline Denmark, MD  Do you think patient would benefit from EUS?  Thanks-JLL

## 2022-04-04 NOTE — Telephone Encounter (Signed)
EUS scheduled, pt instructed and medications reviewed.  Patient instructions mailed to home and sent to My Chart .  Patient to call with any questions or concerns.  

## 2022-04-04 NOTE — Telephone Encounter (Signed)
Called and spoke with patient regarding recommendations regarding EUS and additional labs. Pt would like to proceed. Pt knows that he can stop by the lab at his convenience to have labs drawn. I provided pt with the lab location and hours. Pt knows to expect a call from [place], RN to set up his EUS appt. Pt verbalized understanding and had no concerns at the end of the call.   Lab orders in epic.   Patty, please see message below from Dr. Rush Landmark. Thanks

## 2022-04-04 NOTE — Telephone Encounter (Signed)
The pt has been scheduled for 06/08/22 at 9 am at [facility] with GM  Left message on machine to call back

## 2022-04-05 NOTE — Addendum Note (Signed)
Addended by: Yevette Edwards on: 04/05/2022 10:10 AM   Modules accepted: Orders

## 2022-05-03 ENCOUNTER — Other Ambulatory Visit (INDEPENDENT_AMBULATORY_CARE_PROVIDER_SITE_OTHER): Payer: Medicare HMO

## 2022-05-03 DIAGNOSIS — K859 Acute pancreatitis without necrosis or infection, unspecified: Secondary | ICD-10-CM | POA: Diagnosis not present

## 2022-05-03 LAB — LIPASE: Lipase: 29 U/L (ref 11.0–59.0)

## 2022-05-03 LAB — COMPREHENSIVE METABOLIC PANEL
ALT: 30 U/L (ref 0–53)
AST: 25 U/L (ref 0–37)
Albumin: 4.9 g/dL (ref 3.5–5.2)
Alkaline Phosphatase: 67 U/L (ref 39–117)
BUN: 27 mg/dL — ABNORMAL HIGH (ref 6–23)
CO2: 25 mEq/L (ref 19–32)
Calcium: 9.8 mg/dL (ref 8.4–10.5)
Chloride: 106 mEq/L (ref 96–112)
Creatinine, Ser: 1.25 mg/dL (ref 0.40–1.50)
GFR: 57.58 mL/min — ABNORMAL LOW (ref >60.00)
Glucose, Bld: 105 mg/dL — ABNORMAL HIGH (ref 70–99)
Potassium: 4.6 mEq/L (ref 3.5–5.1)
Sodium: 140 mEq/L (ref 135–145)
Total Bilirubin: 1.4 mg/dL — ABNORMAL HIGH (ref 0.2–1.2)
Total Protein: 7.4 g/dL (ref 6.0–8.3)

## 2022-05-06 LAB — IGG 4: IgG, Subclass 4: 22 mg/dL (ref 2–96)

## 2022-05-06 LAB — ANA: Anti Nuclear Antibody (ANA): NEGATIVE

## 2022-06-01 ENCOUNTER — Encounter (HOSPITAL_COMMUNITY): Payer: Self-pay | Admitting: Gastroenterology

## 2022-06-01 NOTE — Progress Notes (Signed)
Attempted to obtain medical history via telephone, unable to reach at this time. HIPAA compliant voicemail message left requesting return call to pre surgical testing department. 

## 2022-06-07 DIAGNOSIS — M25562 Pain in left knee: Secondary | ICD-10-CM | POA: Diagnosis not present

## 2022-06-07 DIAGNOSIS — M25462 Effusion, left knee: Secondary | ICD-10-CM | POA: Diagnosis not present

## 2022-06-07 DIAGNOSIS — M1712 Unilateral primary osteoarthritis, left knee: Secondary | ICD-10-CM | POA: Diagnosis not present

## 2022-06-07 NOTE — Anesthesia Preprocedure Evaluation (Signed)
Anesthesia Evaluation  Patient identified by MRN, date of birth, ID band Patient awake    Reviewed: Allergy & Precautions, NPO status , Patient's Chart, lab work & pertinent test results  History of Anesthesia Complications (+) PONV and history of anesthetic complications  Airway Mallampati: I  TM Distance: >3 FB Neck ROM: Full    Dental  (+) Dental Advisory Given, Caps   Pulmonary sleep apnea ,    Pulmonary exam normal        Cardiovascular hypertension, Normal cardiovascular exam+ Valvular Problems/Murmurs MR    '19 TTE - EF 60% to 65%. Grade 2 diastolic dysfunction. Mild AI, mild-mod central MR. LA was moderately dilated. RA was mildly dilated. PASP: 34 mm Hg, mildly elevated.  '19 Cath - Moderate non-obstructive CAD    Neuro/Psych negative neurological ROS  negative psych ROS   GI/Hepatic negative GI ROS, Neg liver ROS,   Endo/Other   Obesity   Renal/GU CRFRenal disease     Musculoskeletal  (+) Arthritis ,   Abdominal   Peds  Hematology negative hematology ROS (+)   Anesthesia Other Findings   Reproductive/Obstetrics                            Anesthesia Physical Anesthesia Plan  ASA: 3  Anesthesia Plan: MAC   Post-op Pain Management: Minimal or no pain anticipated   Induction:   PONV Risk Score and Plan: 2 and Propofol infusion and Treatment may vary due to age or medical condition  Airway Management Planned: Nasal Cannula and Natural Airway  Additional Equipment: None  Intra-op Plan:   Post-operative Plan:   Informed Consent: I have reviewed the patients History and Physical, chart, labs and discussed the procedure including the risks, benefits and alternatives for the proposed anesthesia with the patient or authorized representative who has indicated his/her understanding and acceptance.       Plan Discussed with: CRNA and Anesthesiologist  Anesthesia Plan  Comments:        Anesthesia Quick Evaluation

## 2022-06-08 ENCOUNTER — Encounter (HOSPITAL_COMMUNITY)
Admission: RE | Disposition: A | Discharge status: Home / Self Care | Payer: Self-pay | Source: Home / Self Care | Attending: Gastroenterology

## 2022-06-08 ENCOUNTER — Ambulatory Visit (HOSPITAL_COMMUNITY): Payer: Medicare HMO | Admitting: Anesthesiology

## 2022-06-08 ENCOUNTER — Ambulatory Visit (HOSPITAL_BASED_OUTPATIENT_CLINIC_OR_DEPARTMENT_OTHER): Payer: Medicare HMO | Admitting: Anesthesiology

## 2022-06-08 ENCOUNTER — Encounter (HOSPITAL_COMMUNITY): Payer: Self-pay | Admitting: Gastroenterology

## 2022-06-08 ENCOUNTER — Other Ambulatory Visit: Payer: Self-pay

## 2022-06-08 ENCOUNTER — Ambulatory Visit (HOSPITAL_COMMUNITY)
Admission: RE | Admit: 2022-06-08 | Discharge: 2022-06-08 | Disposition: A | Discharge status: Home / Self Care | Payer: Medicare HMO | Attending: Gastroenterology | Admitting: Gastroenterology

## 2022-06-08 DIAGNOSIS — K838 Other specified diseases of biliary tract: Secondary | ICD-10-CM | POA: Diagnosis not present

## 2022-06-08 DIAGNOSIS — G4730 Sleep apnea, unspecified: Secondary | ICD-10-CM

## 2022-06-08 DIAGNOSIS — K869 Disease of pancreas, unspecified: Secondary | ICD-10-CM | POA: Insufficient documentation

## 2022-06-08 DIAGNOSIS — E669 Obesity, unspecified: Secondary | ICD-10-CM | POA: Insufficient documentation

## 2022-06-08 DIAGNOSIS — K319 Disease of stomach and duodenum, unspecified: Secondary | ICD-10-CM | POA: Diagnosis not present

## 2022-06-08 DIAGNOSIS — K449 Diaphragmatic hernia without obstruction or gangrene: Secondary | ICD-10-CM | POA: Insufficient documentation

## 2022-06-08 DIAGNOSIS — K859 Acute pancreatitis without necrosis or infection, unspecified: Secondary | ICD-10-CM

## 2022-06-08 DIAGNOSIS — K2990 Gastroduodenitis, unspecified, without bleeding: Secondary | ICD-10-CM

## 2022-06-08 DIAGNOSIS — K259 Gastric ulcer, unspecified as acute or chronic, without hemorrhage or perforation: Secondary | ICD-10-CM | POA: Diagnosis not present

## 2022-06-08 DIAGNOSIS — K2289 Other specified disease of esophagus: Secondary | ICD-10-CM | POA: Diagnosis not present

## 2022-06-08 DIAGNOSIS — K2970 Gastritis, unspecified, without bleeding: Secondary | ICD-10-CM | POA: Diagnosis not present

## 2022-06-08 DIAGNOSIS — R590 Localized enlarged lymph nodes: Secondary | ICD-10-CM | POA: Diagnosis not present

## 2022-06-08 DIAGNOSIS — K2270 Barrett's esophagus without dysplasia: Secondary | ICD-10-CM | POA: Diagnosis not present

## 2022-06-08 DIAGNOSIS — K862 Cyst of pancreas: Secondary | ICD-10-CM | POA: Insufficient documentation

## 2022-06-08 DIAGNOSIS — R935 Abnormal findings on diagnostic imaging of other abdominal regions, including retroperitoneum: Secondary | ICD-10-CM | POA: Insufficient documentation

## 2022-06-08 DIAGNOSIS — K2980 Duodenitis without bleeding: Secondary | ICD-10-CM | POA: Insufficient documentation

## 2022-06-08 HISTORY — PX: ESOPHAGOGASTRODUODENOSCOPY (EGD) WITH PROPOFOL: SHX5813

## 2022-06-08 HISTORY — PX: EUS: SHX5427

## 2022-06-08 HISTORY — PX: BIOPSY: SHX5522

## 2022-06-08 SURGERY — ESOPHAGOGASTRODUODENOSCOPY (EGD) WITH PROPOFOL
Anesthesia: Monitor Anesthesia Care

## 2022-06-08 MED ORDER — SODIUM CHLORIDE 0.9 % IV SOLN: INTRAVENOUS | Status: DC

## 2022-06-08 MED ORDER — PROPOFOL 500 MG/50ML IV EMUL
INTRAVENOUS | Status: AC
  Filled 2022-06-08: qty 50

## 2022-06-08 MED ORDER — LIDOCAINE 2% (20 MG/ML) 5 ML SYRINGE
INTRAMUSCULAR | Status: DC | PRN
  Administered 2022-06-08: 40 mg via INTRAVENOUS

## 2022-06-08 MED ORDER — PROPOFOL 1000 MG/100ML IV EMUL
INTRAVENOUS | Status: AC
  Filled 2022-06-08: qty 100

## 2022-06-08 MED ORDER — PROPOFOL 500 MG/50ML IV EMUL
INTRAVENOUS | Status: DC | PRN
  Administered 2022-06-08: 135 ug/kg/min via INTRAVENOUS

## 2022-06-08 MED ORDER — PROPOFOL 10 MG/ML IV BOLUS
INTRAVENOUS | Status: DC | PRN
  Administered 2022-06-08: 10 mg via INTRAVENOUS
  Administered 2022-06-08: 20 mg via INTRAVENOUS
  Administered 2022-06-08: 10 mg via INTRAVENOUS

## 2022-06-08 NOTE — Transfer of Care (Signed)
Immediate Anesthesia Transfer of Care Note  Patient: Jacob Le  Procedure(s) Performed: ESOPHAGOGASTRODUODENOSCOPY (EGD) WITH PROPOFOL BIOPSY UPPER ENDOSCOPIC ULTRASOUND (EUS) LINEAR  Patient Location: PACU  Anesthesia Type:MAC  Level of Consciousness: drowsy  Airway & Oxygen Therapy: Patient Spontanous Breathing and Patient connected to face mask oxygen  Post-op Assessment: Report given to RN, Post -op Vital signs reviewed and stable and Patient moving all extremities X 4  Post vital signs: Reviewed and stable  Last Vitals:  Vitals Value Taken Time  BP 130/60   Temp    Pulse 65 06/08/22 0944  Resp 17 06/08/22 0944  SpO2 99 % 06/08/22 0944  Vitals shown include unvalidated device data.  Last Pain:  Vitals:   06/08/22 0824  TempSrc: Temporal  PainSc: 0-No pain         Complications: No notable events documented.

## 2022-06-08 NOTE — H&P (Signed)
GASTROENTEROLOGY PROCEDURE H&P NOTE   Primary Care Physician: Elby Showers, MD  HPI: Daryll Spisak is a 71 y.o. 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  Past Medical History:  Diagnosis Date   Chronic kidney disease    stage 3   Dysrhythmia    2nd degree heart block, Type 1   H/O nasal septoplasty 1996   Hyperlipidemia    on meds   PONV (postoperative nausea and vomiting)    Sleep apnea    without CPAP   Squamous cell cancer of external ear, left 2018   Past Surgical History:  Procedure Laterality Date   broken left shoulder     no surgerical intervention   CHOLECYSTECTOMY     COLONOSCOPY  2012   In [place]   KNEE ARTHROSCOPY W/ ACL RECONSTRUCTION Left 1990   LEFT HEART CATH AND CORONARY ANGIOGRAPHY N/A 12/18/2017   Procedure: LEFT HEART CATH AND CORONARY ANGIOGRAPHY;  Surgeon: Burnell Blanks, MD;  Location: [facility];  Service: Cardiovascular;  Laterality: N/A;   MENISCUS REPAIR Right 1992   pancreatis attack     pericarditis     ROTATOR CUFF REPAIR Right 2014   No current facility-administered medications for this encounter.   No current facility-administered medications for this encounter. Allergies  Allergen Reactions   Codeine Nausea And Vomiting   Other Nausea And Vomiting    Any narcotic   Family History  Problem Relation Age of Onset   Cancer Mother    Colon cancer Neg Hx    Colon polyps Neg Hx    Esophageal cancer Neg Hx    Prostate cancer Neg Hx    Rectal cancer Neg Hx    Stomach cancer Neg Hx    Social History   Socioeconomic History   Marital status: Married    Spouse name: Not on file   Number of children: Not on file   Years of education: Not on file   Highest education level: Not on file  Occupational History   Not on file  Tobacco Use   Smoking status: Never   Smokeless tobacco: Never  Vaping Use   Vaping Use: Never  used  Substance and Sexual Activity   Alcohol use: Not Currently    Alcohol/week: 0.0 - 4.0 standard drinks of alcohol   Drug use: No   Sexual activity: Yes  Other Topics Concern   Not on file  Social History Narrative   Not on file   Social Determinants of Health   Financial Resource Strain: Not on file  Food Insecurity: Not on file  Transportation Needs: Not on file  Physical Activity: Not on file  Stress: Not on file  Social Connections: Not on file  Intimate Partner Violence: Not on file    Physical Exam: There were no vitals filed for this visit. There is no height or weight on file to calculate BMI. GEN: NAD EYE: Sclerae anicteric ENT: MMM CV: Non-tachycardic GI: Soft, NT/ND NEURO:  Alert & Oriented x 3  Lab Results: No results for input(s): "WBC", "HGB", "HCT", "PLT" in the last 72 hours. BMET No results for input(s): "NA", "K", "CL", "CO2", "GLUCOSE", "BUN", "CREATININE", "CALCIUM" in the last 72 hours. LFT No results for input(s): "PROT", "ALBUMIN", "AST", "ALT", "ALKPHOS", "BILITOT", "BILIDIR", "IBILI" in the last 72 hours. PT/INR No results for input(s): "LABPROT", "INR" in the last 72 hours.   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  The risks of an EUS including intestinal perforation, bleeding, infection, aspiration, and medication effects were discussed as was the possibility it may not give a definitive diagnosis if a biopsy is performed.  When a biopsy of the pancreas is done as part of the EUS, there is an additional risk of pancreatitis at the rate of about 1-2%.  It was explained that procedure related pancreatitis is typically mild, although it can be severe and even life threatening, which is why we do not perform random pancreatic biopsies and only biopsy a lesion/area we feel is concerning enough to warrant the  risk.   The risks and benefits of endoscopic evaluation/treatment were discussed with the patient and/or family; these include but are not limited to the risk of perforation, infection, bleeding, missed lesions, lack of diagnosis, severe illness requiring hospitalization, as well as anesthesia and sedation related illnesses.  The patient's history has been reviewed, patient examined, no change in status, and deemed stable for procedure.  The patient and/or family is agreeable to proceed.    Justice Britain, MD [facility] Office # [phone]

## 2022-06-08 NOTE — Op Note (Signed)
[facility] Patient Name: Jacob Le Procedure Date: 06/08/2022 MRN: [id number] Attending MD: Justice Britain , MD Date of Birth: 09/10/1951 CSN: [id number] Age: 71 Admit Type: Outpatient Procedure:                Upper EUS Indications:              Abnormal abdominal/pelvic CT scan, Acute recurrent                            pancreatitis Providers:                Justice Britain, MD, Carlyn Reichert, RN, [facility] Technician, Technician Referring MD:              Medicines:                Monitored Anesthesia Care Complications:            No immediate complications. Estimated Blood Loss:     Estimated blood loss was minimal. Procedure:                Pre-Anesthesia Assessment:                           - Prior to the procedure, a History and Physical                            was performed, and patient medications and                            allergies were reviewed. The patient's tolerance of                            previous anesthesia was also reviewed. The risks                            and benefits of the procedure and the sedation                            options and risks were discussed with the patient.                            All questions were answered, and informed consent                            was obtained. Prior Anticoagulants: The patient has                            taken no previous anticoagulant or antiplatelet                            agents. ASA Grade Assessment: III - A patient with                            severe systemic  disease. After reviewing the risks                            and benefits, the patient was deemed in                            satisfactory condition to undergo the procedure.                           After obtaining informed consent, the endoscope was                            passed under direct vision. Throughout the                            procedure, the  patient's blood pressure, pulse, and                            oxygen saturations were monitored continuously. The                            [id number]-H190 ([id number]) Olympus endoscope was introduced                            through the mouth, and advanced to the second part                            of duodenum. The [id number]-[id number] ([id number]) Olympus                            duodenoscope was introduced through the mouth, and                            advanced to the area of papilla. The [id number]                            ([id number]) Olympus linear ultrasound scope was                            introduced through the mouth, and advanced to the                            duodenum for ultrasound examination from the                            stomach and duodenum. The upper EUS was                            accomplished without difficulty. The patient                            tolerated the procedure. Scope In: Scope Out: Findings:      ENDOSCOPIC FINDING: :      No gross lesions were noted in the proximal  esophagus and in the mid       esophagus.      Three tongues of salmon-colored mucosa were present in the distal       esophagus from 32 to 35 cm, three tongues of salmon-colored mucosa were       present from 33 to 35 cm and three tongues of salmon-colored mucosa were       present from 34 to 35 cm. No other visible abnormalities were present.       The maximum longitudinal extent of these esophageal mucosal changes was       3 cm in length. Biopsies were taken with a cold forceps for histology to       rule out Barrett's.      The Z-line was irregular and was found 35 cm from the incisors.      A 4 cm hiatal hernia was present.      One non-bleeding linear gastric ulcer with a clean ulcer base (Forrest       Class III) was found on the greater curvature of the stomach. The lesion       was 7 mm in largest dimension.      Patchy moderate inflammation characterized by erosions, erythema  and       granularity were found in the entire examined stomach. Biopsies were       taken with a cold forceps for histology and Helicobacter pylori testing.      Patchy mild inflammation characterized by friability and granularity was       found in the duodenal bulb, in the first portion of the duodenum and in       the second portion of the duodenum. Biopsies were taken with a cold       forceps for histology and HP evaluation.      The major papilla was normal in appearance.      ENDOSONOGRAPHIC FINDING: :      Pancreatic parenchymal abnormalities were noted in the entire pancreas.       These consisted of hyperechoic strands.      Anechoic lesions suggestive of three cysts were identified in the       pancreatic tail. The first cyst measured 4 mm by 4 mm. The second cyst       measured 3 mm by 4 mm. The third cyst measured 2 mm by 4 mm. There was       no associated mass.      Endosonographic imaging in the entire pancreas showed no mass. Overt       pancreatic divisum was not present on EUS evaluation today either.      There was dilation in the common hepatic duct which measured up to 9 mm.       The common bile duct measured 3.1 mm -> 5.5 mm. No stones or sludge were       noted.      Endosonographic imaging of the ampulla showed no intramural lesion.      One enlarged lymph node was visualized in the porta hepatis region. It       measured 4.9 mm by 3.9 mm in maximal cross-sectional diameter. The node       was triangular, isoechoic and had well defined margins.      Endosonographic imaging in the visualized portion of the liver showed no       mass-lesion.      The celiac region was visualized.  Impression:               EGD Impression:                           - No gross lesions in esophagus proximally.                            Salmon-colored mucosal tongues suspicious for                            short-segment Barrett's esophagus distally                             -biopsied.                           - Z-line irregular, 35 cm from the incisors.                           - 4 cm hiatal hernia.                           - Non-bleeding gastric ulcer with a clean ulcer                            base (Forrest Class III). Gastritis. Biopsied.                           - Duodenitis. Biopsied.                           - Normal major papilla.                           EUS Impression:                           - Pancreatic parenchymal abnormalities consisting                            of hyperechoic strands were noted in the entire                            pancreas.                           - Three cystic lesions were seen in the pancreatic                            tail. Tissue has not been obtained. However, the                            endosonographic appearance is suspicious for a                            pancreatic pseudocyst.                           -  No masses noted in pancreas. Changes found do not                            meet criteria for Chronic Pancreatitis.                           - There was dilation in the common hepatic duct                            which measured up to 9 mm but hte bile duct was                            smooth and tapered to the ampulla without                            stones/debris.                           - One enlarged lymph node was visualized in the                            porta hepatis region. Tissue has not been obtained.                            However, the endosonographic appearance is                            consistent with benign inflammatory changes. Moderate Sedation:      Not Applicable - Patient had care per Anesthesia. Recommendation:           - The patient will be observed post-procedure,                            until all discharge criteria are met.                           - Discharge patient to home.                           - Patient has a contact number available  for                            emergencies. The signs and symptoms of potential                            delayed complications were discussed with the                            patient. Return to normal activities tomorrow.                            Written discharge instructions were provided to the  patient.                           - Resume previous diet.                           - Observe patient's clinical course.                           - Await path results.                           - Start Omeprazole 40 mg twice daily for next                            62-month and then decrease to once daily for                            healing of gastritis/ulcer.                           - Follow up with Dr. GLyndel Safeand consider repeat EGD                            in 3-4 months to ensure healing of the gastric                            ulcer.                           - If recurrent episodes of pancreatitis develop                            without other clear source, then consider referral                            to [facility] for Manometry to consider Pancreatic SOD                            evaluation.                           - The findings and recommendations were discussed                            with the patient.                           - The findings and recommendations were discussed                            with the patient's family. Procedure Code(s):        --- Professional ---                           [id number] Esophagogastroduodenoscopy, flexible,  transoral; with endoscopic ultrasound examination                            limited to the esophagus, stomach or duodenum, and                            adjacent structures                           43239, Esophagogastroduodenoscopy, flexible,                            transoral; with biopsy, single or multiple Diagnosis Code(s):        --- Professional ---                            K22.8, Other specified diseases of esophagus                           K44.9, Diaphragmatic hernia without obstruction or                            gangrene                           K25.9, Gastric ulcer, unspecified as acute or                            chronic, without hemorrhage or perforation                           K29.70, Gastritis, unspecified, without bleeding                           K29.80, Duodenitis without bleeding                           K86.9, Disease of pancreas, unspecified                           K86.2, Cyst of pancreas                           R59.0, Localized enlarged lymph nodes                           K85.90, Acute pancreatitis without necrosis or                            infection, unspecified                           K83.8, Other specified diseases of biliary tract                           R93.5, Abnormal findings on diagnostic imaging of  other abdominal regions, including retroperitoneum CPT copyright 2019 American Medical Association. All rights reserved. The codes documented in this report are preliminary and upon coder review may  be revised to meet current compliance requirements. Justice Britain, MD 06/08/2022 9:59:24 AM Number of Addenda: 0

## 2022-06-08 NOTE — Discharge Instructions (Signed)

## 2022-06-08 NOTE — Anesthesia Procedure Notes (Signed)
Procedure Name: MAC Date/Time: 06/08/2022 9:06 AM  Performed by: Niel Hummer, CRNAPre-anesthesia Checklist: Patient identified, Emergency Drugs available, Suction available and Patient being monitored Oxygen Delivery Method: Simple face mask

## 2022-06-08 NOTE — Anesthesia Postprocedure Evaluation (Signed)
Anesthesia Post Note  Patient: [name]  Procedure(s) Performed: ESOPHAGOGASTRODUODENOSCOPY (EGD) WITH PROPOFOL BIOPSY UPPER ENDOSCOPIC ULTRASOUND (EUS) LINEAR     Patient location during evaluation: PACU Anesthesia Type: MAC Level of consciousness: awake and alert Pain management: pain level controlled Vital Signs Assessment: post-procedure vital signs reviewed and stable Respiratory status: spontaneous breathing, nonlabored ventilation and respiratory function stable Cardiovascular status: stable and blood pressure returned to baseline Anesthetic complications: no   No notable events documented.  Last Vitals:  Vitals:   06/08/22 1004 06/08/22 1014  BP: (!) 150/76 (!) 145/78  Pulse: 60 (!) 53  Resp: 18 14  Temp:    SpO2: 96% 97%    Last Pain:  Vitals:   06/08/22 1014  TempSrc:   PainSc: 0-No pain                 Jacob Le

## 2022-06-11 ENCOUNTER — Encounter (HOSPITAL_COMMUNITY): Payer: Self-pay | Admitting: Gastroenterology

## 2022-06-12 ENCOUNTER — Telehealth: Payer: Self-pay | Admitting: Gastroenterology

## 2022-06-12 LAB — SURGICAL PATHOLOGY

## 2022-06-12 MED ORDER — OMEPRAZOLE 40 MG PO CPDR: 40.0000 mg | DELAYED_RELEASE_CAPSULE | Freq: Two times a day (BID) | ORAL | 6 refills | Status: DC

## 2022-06-12 NOTE — Telephone Encounter (Signed)
The prescription has been sent and pt aware  

## 2022-06-12 NOTE — Telephone Encounter (Signed)
Patient called states he went to his pharmacy to pick up Omeprazole 40 mg and medication was not call in. He states he needs the medication to be call in and also requesting a call back once the prescription is called in. Thank you

## 2022-06-13 ENCOUNTER — Encounter: Payer: Self-pay | Admitting: Gastroenterology

## 2022-06-14 DIAGNOSIS — M25462 Effusion, left knee: Secondary | ICD-10-CM | POA: Diagnosis not present

## 2022-06-14 DIAGNOSIS — M25562 Pain in left knee: Secondary | ICD-10-CM | POA: Diagnosis not present

## 2022-07-11 DIAGNOSIS — M1712 Unilateral primary osteoarthritis, left knee: Secondary | ICD-10-CM | POA: Diagnosis not present

## 2022-07-23 ENCOUNTER — Other Ambulatory Visit: Payer: Self-pay | Admitting: Internal Medicine

## 2022-07-27 ENCOUNTER — Telehealth: Payer: Self-pay | Admitting: Internal Medicine

## 2022-07-27 ENCOUNTER — Telehealth: Payer: Self-pay | Admitting: *Deleted

## 2022-07-27 DIAGNOSIS — M1712 Unilateral primary osteoarthritis, left knee: Secondary | ICD-10-CM | POA: Diagnosis not present

## 2022-07-27 NOTE — Telephone Encounter (Signed)
   Pre-operative Risk Assessment    Patient Name: Jacob Le  DOB: 1951/07/26 MRN: [id number]      Request for Surgical Clearance    Procedure:   LEFT TKR  Date of Surgery:  Clearance TBD                                 Surgeon:  DR. Lara Mulch Surgeon's Group or Practice Name:  [facility]  Phone number:  [phone] Fax number:  [phone] ATTN: COURTNEY BLUM   Type of Clearance Requested:   - Medical ; NO MEDICATIONS LISTED AS NEEDING TO BE HELD   Type of Anesthesia:  Spinal   Additional requests/questions:    Jiles Prows   07/27/2022, 12:18 PM

## 2022-07-27 NOTE — Telephone Encounter (Signed)
Jacob Le [phone]  Sam stopped by office and dropped off form for surgery clearance. He was thinking you would just sign it. I let him know this requires an office visit, with labs for clearance. He said he had to also take one by Dr Camillia Herter office to and get cardiology clearance as well. I scheduled him for labs tomorrow and appointment next week. He has CPE labs scheduled in November, should we go ahead and get all of them now?

## 2022-07-27 NOTE — Telephone Encounter (Signed)
   Name: Jacob Le  DOB: 02-Jul-1951  MRN: [id number]  Primary Cardiologist: Lauree Chandler, MD  Chart reviewed as part of pre-operative protocol coverage. Because of Jacob Le's past medical history and time since last visit, he will require a follow-up in-office visit in order to better assess preoperative cardiovascular risk.  Pre-op covering staff: - Please schedule appointment and call patient to inform them. If patient already had an upcoming appointment within acceptable timeframe, please add "pre-op clearance" to the appointment notes so provider is aware. - Please contact requesting surgeon's office via preferred method (i.e, phone, fax) to inform them of need for appointment prior to surgery.  No medications are indicated as needing held.  Elgie Collard, PA-C  07/27/2022, 1:26 PM

## 2022-07-27 NOTE — Telephone Encounter (Signed)
Pt is agreeable to plan of care for in office appt 07/31/22 with Nicholes Rough, PAC.

## 2022-07-27 NOTE — Telephone Encounter (Signed)
Labs pended to tomorrows lab appt

## 2022-07-28 ENCOUNTER — Other Ambulatory Visit: Payer: Medicare HMO

## 2022-07-28 DIAGNOSIS — Z8679 Personal history of other diseases of the circulatory system: Secondary | ICD-10-CM | POA: Diagnosis not present

## 2022-07-28 DIAGNOSIS — E7800 Pure hypercholesterolemia, unspecified: Secondary | ICD-10-CM | POA: Diagnosis not present

## 2022-07-28 DIAGNOSIS — R739 Hyperglycemia, unspecified: Secondary | ICD-10-CM | POA: Diagnosis not present

## 2022-07-28 DIAGNOSIS — Z8719 Personal history of other diseases of the digestive system: Secondary | ICD-10-CM | POA: Diagnosis not present

## 2022-07-28 DIAGNOSIS — Z125 Encounter for screening for malignant neoplasm of prostate: Secondary | ICD-10-CM

## 2022-07-28 DIAGNOSIS — N1831 Chronic kidney disease, stage 3a: Secondary | ICD-10-CM | POA: Diagnosis not present

## 2022-07-28 DIAGNOSIS — Z01818 Encounter for other preprocedural examination: Secondary | ICD-10-CM | POA: Diagnosis not present

## 2022-07-29 LAB — COMPLETE METABOLIC PANEL WITH GFR
AG Ratio: 2.1 (calc) (ref 1.0–2.5)
ALT: 32 U/L (ref 9–46)
AST: 25 U/L (ref 10–35)
Albumin: 4.6 g/dL (ref 3.6–5.1)
Alkaline phosphatase (APISO): 68 U/L (ref 35–144)
BUN: 19 mg/dL (ref 7–25)
CO2: 27 mmol/L (ref 20–32)
Calcium: 10.1 mg/dL (ref 8.6–10.3)
Chloride: 105 mmol/L (ref 98–110)
Creat: 1.13 mg/dL (ref 0.70–1.28)
Globulin: 2.2 g/dL (calc) (ref 1.9–3.7)
Glucose, Bld: 105 mg/dL — ABNORMAL HIGH (ref 65–99)
Potassium: 5.3 mmol/L (ref 3.5–5.3)
Sodium: 141 mmol/L (ref 135–146)
Total Bilirubin: 1.4 mg/dL — ABNORMAL HIGH (ref 0.2–1.2)
Total Protein: 6.8 g/dL (ref 6.1–8.1)
eGFR: 69 mL/min/{1.73_m2} (ref >=60)

## 2022-07-29 LAB — CBC WITH DIFFERENTIAL/PLATELET
Absolute Monocytes: 728 cells/uL (ref 200–950)
Basophils Absolute: 53 cells/uL (ref 0–200)
Basophils Relative: 0.7 %
Eosinophils Absolute: 293 cells/uL (ref 15–500)
Eosinophils Relative: 3.9 %
HCT: 44.1 % (ref 38.5–50.0)
Hemoglobin: 15.4 g/dL (ref 13.2–17.1)
Lymphs Abs: 1868 cells/uL (ref 850–3900)
MCH: 30.9 pg (ref 27.0–33.0)
MCHC: 34.9 g/dL (ref 32.0–36.0)
MCV: 88.4 fL (ref 80.0–100.0)
MPV: 10 fL (ref 7.5–12.5)
Monocytes Relative: 9.7 %
Neutro Abs: 4560 cells/uL (ref 1500–7800)
Neutrophils Relative %: 60.8 %
Platelets: 285 10*3/uL (ref 140–400)
RBC: 4.99 10*6/uL (ref 4.20–5.80)
RDW: 13 % (ref 11.0–15.0)
Total Lymphocyte: 24.9 %
WBC: 7.5 10*3/uL (ref 3.8–10.8)

## 2022-07-29 LAB — LIPID PANEL
Cholesterol: 165 mg/dL (ref <200)
HDL: 50 mg/dL (ref >=40)
LDL Cholesterol (Calc): 90 mg/dL (calc)
Non-HDL Cholesterol (Calc): 115 mg/dL (calc) (ref <130)
Total CHOL/HDL Ratio: 3.3 (calc) (ref <5.0)
Triglycerides: 151 mg/dL — ABNORMAL HIGH (ref <150)

## 2022-07-29 LAB — HEMOGLOBIN A1C
Hgb A1c MFr Bld: 6.3 % of total Hgb — ABNORMAL HIGH (ref <5.7)
Mean Plasma Glucose: 134 mg/dL
eAG (mmol/L): 7.4 mmol/L

## 2022-07-29 LAB — PSA: PSA: 1.42 ng/mL (ref <=4.00)

## 2022-07-30 NOTE — Progress Notes (Unsigned)
Office Visit    Patient Name: Jacob Le Date of Encounter: 07/31/2022  PCP:  Elby Showers, MD   [place]  Cardiologist:  Lauree Chandler, MD  Advanced Practice Provider:  No care team member to display Electrophysiologist:  None   HPI    Jacob Le is a 71 y.o. male with past medical history significant for CAD (nonobstructive disease on cardiac cath 12/2017), history of pericarditis (2019) mild to moderate MR, second-degree AV block type I, hyperlipidemia, OSA presents today for preop clearance.  The patient was last seen in 2020.  He was doing well at that time without any chest pain, shortness of breath, syncope, leg swelling.  He remained active and was riding a bike and playing pickle ball.  He was complaining of back pain from a herniated disc and spinal stenosis.  Rosuvastatin was making it worse.  He had noted some elevated blood pressures.  Today, he is asymptomatic.  He does have a prolonged QTc on EKG.  He is asymptomatic at this time.  He was recently started on Prilosec 40 mg twice a day.  He plans to reduce this to 40 mg once a day.  This may be contributing to his prolonged QTc.  We will plan to repeat an EKG in 4 weeks to reevaluate.  In the meantime we will go ahead and order some labs today.  He is being evaluated today for a left knee replacement and needs cardiac clearance.  No chest pain or shortness of breath.  He has scored a 5.62 METS on the DASI.  This exceeds the 4 METS minimum requirement.  Reports no shortness of breath nor dyspnea on exertion. Reports no chest pain, pressure, or tightness. No edema, orthopnea, PND. Reports no palpitations.   Past Medical History    Past Medical History:  Diagnosis Date   Chronic kidney disease    stage 3   Dysrhythmia    2nd degree heart block, Type 1   H/O nasal septoplasty 1996   Hyperlipidemia    on meds   PONV (postoperative nausea and vomiting)    Sleep apnea    without  CPAP   Squamous cell cancer of external ear, left 2018   Past Surgical History:  Procedure Laterality Date   BIOPSY  06/08/2022   Procedure: BIOPSY;  Surgeon: Irving Copas., MD;  Location: [facility];  Service: Gastroenterology;;   broken left shoulder     no surgerical intervention   CHOLECYSTECTOMY     COLONOSCOPY  2012   In [place]   ESOPHAGOGASTRODUODENOSCOPY (EGD) WITH PROPOFOL N/A 06/08/2022   Procedure: ESOPHAGOGASTRODUODENOSCOPY (EGD) WITH PROPOFOL;  Surgeon: Irving Copas., MD;  Location: [facility];  Service: Gastroenterology;  Laterality: N/A;   EUS  06/08/2022   Procedure: UPPER ENDOSCOPIC ULTRASOUND (EUS) LINEAR;  Surgeon: Rush Landmark Telford Nab., MD;  Location: [facility];  Service: Gastroenterology;;   KNEE ARTHROSCOPY W/ ACL RECONSTRUCTION Left 1990   LEFT HEART CATH AND CORONARY ANGIOGRAPHY N/A 12/18/2017   Procedure: LEFT HEART CATH AND CORONARY ANGIOGRAPHY;  Surgeon: Burnell Blanks, MD;  Location: [facility];  Service: Cardiovascular;  Laterality: N/A;   MENISCUS REPAIR Right 1992   pancreatis attack     pericarditis     ROTATOR CUFF REPAIR Right 2014    Allergies  Allergies  Allergen Reactions   Codeine Nausea And Vomiting   Other Nausea And Vomiting    Any narcotic    EKGs/Labs/Other Studies Reviewed:  The following studies were reviewed today: none  EKG:  EKG is  ordered today.  The ekg ordered today demonstrates accelerated junctional rhythm, prolonged QTc at 537, rate 82 bpm  Recent Labs: 07/28/2022: ALT 32; BUN 19; Creat 1.13; Hemoglobin 15.4; Platelets 285; Potassium 5.3; Sodium 141  Recent Lipid Panel    Component Value Date/Time   CHOL 165 07/28/2022 1019   CHOL 138 04/10/2018 1217   TRIG 151 (H) 07/28/2022 1019   HDL 50 07/28/2022 1019   HDL 59 04/10/2018 1217   CHOLHDL 3.3 07/28/2022 1019   VLDL 59 (H) 12/17/2017 2344   LDLCALC 90 07/28/2022 1019     Home Medications   Current Meds   Medication Sig   Coenzyme Q10 100 MG capsule Take 100 mg by mouth daily.   Multiple Vitamins-Minerals (CENTRUM SILVER 50+MEN) TABS Take 1 tablet by mouth daily.   omeprazole (PRILOSEC) 40 MG capsule Take 1 capsule (40 mg total) by mouth 2 (two) times daily before a meal. For 2 months then once daily   rosuvastatin (CRESTOR) 5 MG tablet TAKE 1 TABLET BY MOUTH EVERY DAY   sildenafil (VIAGRA) 100 MG tablet Take 0.5-1 tablets (50-100 mg total) by mouth daily as needed for erectile dysfunction.     Review of Systems      All other systems reviewed and are otherwise negative except as noted above.  Physical Exam    VS:  BP 128/78   Pulse 82   Ht '5\' 7"'$  (1.702 m)   Wt 209 lb (94.8 kg)   SpO2 95%   BMI 32.73 kg/m  , BMI Body mass index is 32.73 kg/m.  Wt Readings from Last 3 Encounters:  07/31/22 209 lb (94.8 kg)  06/08/22 205 lb (93 kg)  03/20/22 212 lb 9.6 oz (96.4 kg)     GEN: Well nourished, well developed, in no acute distress. HEENT: normal. Neck: Supple, no JVD, carotid bruits, or masses. Cardiac: irregular, no murmurs, rubs, or gallops. No clubbing, cyanosis, edema.  Radials/PT 2+ and equal bilaterally.  Respiratory:  Respirations regular and unlabored, clear to auscultation bilaterally. GI: Soft, nontender, nondistended. MS: No deformity or atrophy. Skin: Warm and dry, no rash. Neuro:  Strength and sensation are intact. Psych: Normal affect.  Assessment & Plan    Preop Evaluation   Mr. Manzano's perioperative risk of a major cardiac event is 0.4% according to the Revised Cardiac Risk Index (RCRI).  Therefore, he is at low risk for perioperative complications.   His functional capacity is good at 5.62 METs according to the [place] Activity Status Index (DASI). Recommendations: According to ACC/AHA guidelines, no further cardiovascular testing needed.  The patient may proceed to surgery at acceptable risk.     CAD -no anginal symptoms -continue current medication  regimen  Second-degree AV block, Mobitz type I/prolonged QTc -stable and asymptomatic -he does have a prolonged Qtc today 537 ms -he will be reducing his Prilosec and we will recheck an EKG in 4 weeks.  Blood pressure elevated without HTN -normal BP today         Disposition: Follow up 1 year with Lauree Chandler, MD or APP.  Signed, Elgie Collard, PA-C 07/31/2022, 2:37 PM [facility]

## 2022-07-31 ENCOUNTER — Ambulatory Visit: Payer: Medicare HMO | Attending: Physician Assistant | Admitting: Physician Assistant

## 2022-07-31 ENCOUNTER — Encounter: Payer: Self-pay | Admitting: Physician Assistant

## 2022-07-31 VITALS — BP 128/78 | HR 82 | Ht 67.0 in | Wt 209.0 lb

## 2022-07-31 DIAGNOSIS — I441 Atrioventricular block, second degree: Secondary | ICD-10-CM | POA: Diagnosis not present

## 2022-07-31 DIAGNOSIS — I2510 Atherosclerotic heart disease of native coronary artery without angina pectoris: Secondary | ICD-10-CM | POA: Diagnosis not present

## 2022-07-31 DIAGNOSIS — I10 Essential (primary) hypertension: Secondary | ICD-10-CM | POA: Diagnosis not present

## 2022-07-31 DIAGNOSIS — E785 Hyperlipidemia, unspecified: Secondary | ICD-10-CM

## 2022-07-31 DIAGNOSIS — Z8679 Personal history of other diseases of the circulatory system: Secondary | ICD-10-CM | POA: Diagnosis not present

## 2022-07-31 NOTE — Patient Instructions (Addendum)
Medication Instructions:  Your physician recommends that you continue on your current medications as directed. Please refer to the Current Medication list given to you today.  *If you need a refill on your cardiac medications before your next appointment, please call your pharmacy*   Lab Work: None If you have labs (blood work) drawn today and your tests are completely normal, you will receive your results only by: [place] (if you have MyChart) OR A paper copy in the mail If you have any lab test that is abnormal or we need to change your treatment, we will call you to review the results.   Follow-Up: At [facility], you and your health needs are our priority.  As part of our continuing mission to provide you with exceptional heart care, we have created designated Provider Care Teams.  These Care Teams include your primary Cardiologist (physician) and Advanced Practice Providers (APPs -  Physician Assistants and Nurse Practitioners) who all work together to provide you with the care you need, when you need it.   Your next appointment:   1 year(s)  The format for your next appointment:   In Person  Provider:   Lauree Chandler, MD    Other Instructions Come in to have a repeat EKG on Friday, 09/01/22 at 2:00 PM   Important Information About Sugar

## 2022-08-01 ENCOUNTER — Encounter: Payer: Self-pay | Admitting: Internal Medicine

## 2022-08-01 ENCOUNTER — Ambulatory Visit (INDEPENDENT_AMBULATORY_CARE_PROVIDER_SITE_OTHER): Payer: Medicare HMO | Admitting: Internal Medicine

## 2022-08-01 VITALS — BP 138/78 | HR 51 | Temp 98.3°F | Ht 67.0 in | Wt 207.8 lb

## 2022-08-01 DIAGNOSIS — Z01818 Encounter for other preprocedural examination: Secondary | ICD-10-CM | POA: Diagnosis not present

## 2022-08-01 NOTE — Progress Notes (Signed)
Pt contacted, verified follow up appointment, obtained  information from patient.  Follow up EKG and assessment tomorrow with Ambrose Pancoast, NP.

## 2022-08-01 NOTE — Patient Instructions (Signed)
Ask if cardiology can see patient urgently for evaluation of dysrhythmia

## 2022-08-01 NOTE — Progress Notes (Signed)
PCP / Dr Verlene Mayer  office called and I spoke to [place].    PCP office called for Pt seeking  visit today at [facility] on [address].   No availability this afternoon for a provider appointment;   Per Mariann Laster of Dr. Renold Genta office, EKG looked concerning, I looked at Pt EKG, and took it to Dr. Myles Gip, [place] [facility], Doctor on Duty at [facility] for his assessment.   Dr. Myles Gip recognized EKG from PCP Dr. Verlene Mayer office as Second degree AV block, Mobitz type 1. This was also seen yesterday by Nicholes Rough, PA-C.    Dr. Myles Gip stated if patient is asymptomatic, routine follow up is recommended, as seen in provider note.    Trying to reach Dr. Verlene Mayer office to follow up.

## 2022-08-01 NOTE — Progress Notes (Signed)
   Subjective:    Patient ID: Jacob Le, male    DOB: [birth year], 71 y.o.   MRN: [id number]  HPI Seen here today for pre- op exam for  left TKA. Seen at [place] yesterday. EKG done there reviewed and was abnormal. Was felt to have prolonged QT interval and was told to D/C PPI with follow up in 4 weeks.  Today found to have irreg irreg rhythm. Thought initially he was in A-fib. Repeated EKG and rate is 65  with pauses. Looks more like second degree heart block? Jacob Le?  He is asymptomatic and denuies chest pain or SOB. [place] go [place] with [place] this [place].  He has a history of nonobstructive coronary disease on cardiac cath March 2019, history of pericarditis in 2019 with mild to moderate MR, second-degree AV block type I, hyperlipidemia, obstructive sleep apnea.  History of pancreatitis, history of lumbar spinal stenosis, history of right hip osteoarthritis.  History of chronic kidney disease seen at [facility] felt to be due to to possible cholesterol emboli from cardiac procedure and nephrosclerosis.  Has been told to avoid NSAIDs.  Review of Systems  denies chest pain, SOB     Objective:   Physical Exam  He is in no acute distress.  Vital signs reviewed.  Skin warm and dry.  No carotid bruits.  1/6 to 2/6 systolic ejection murmur and irregular irregular rhythm.  No lower extremity pitting edema.      Assessment & Plan:   I feel that he needs urgent evaluation by Cardiology due to abnormal EKG.  Defer medical clearance for left total knee replacement until Cardiology sees him.

## 2022-08-01 NOTE — Progress Notes (Unsigned)
Office Visit    Patient Name: Jacob Le Date of Encounter: 08/02/2022  Primary Care Provider:  Elby Showers, MD Primary Cardiologist:  Lauree Chandler, MD Primary Electrophysiologist: None  Chief Complaint    Jacob Le is a 71 y.o. male with PMH of nonobstructive CAD, pericarditis, moderate MR, second-degree AV block type I, HLD, sleep apnea who presents today for evaluation of abnormal EKG.  Past Medical History    Past Medical History:  Diagnosis Date   Chronic kidney disease    stage 3   Dysrhythmia    2nd degree heart block, Type 1   H/O nasal septoplasty 1996   Hyperlipidemia    on meds   PONV (postoperative nausea and vomiting)    Sleep apnea    without CPAP   Squamous cell cancer of external ear, left 2018   Past Surgical History:  Procedure Laterality Date   BIOPSY  06/08/2022   Procedure: BIOPSY;  Surgeon: Irving Copas., MD;  Location: [facility];  Service: Gastroenterology;;   broken left shoulder     no surgerical intervention   CHOLECYSTECTOMY     COLONOSCOPY  2012   In [place]   ESOPHAGOGASTRODUODENOSCOPY (EGD) WITH PROPOFOL N/A 06/08/2022   Procedure: ESOPHAGOGASTRODUODENOSCOPY (EGD) WITH PROPOFOL;  Surgeon: Irving Copas., MD;  Location: [facility];  Service: Gastroenterology;  Laterality: N/A;   EUS  06/08/2022   Procedure: UPPER ENDOSCOPIC ULTRASOUND (EUS) LINEAR;  Surgeon: Rush Landmark Telford Nab., MD;  Location: [facility];  Service: Gastroenterology;;   KNEE ARTHROSCOPY W/ ACL RECONSTRUCTION Left 1990   LEFT HEART CATH AND CORONARY ANGIOGRAPHY N/A 12/18/2017   Procedure: LEFT HEART CATH AND CORONARY ANGIOGRAPHY;  Surgeon: Burnell Blanks, MD;  Location: [facility];  Service: Cardiovascular;  Laterality: N/A;   MENISCUS REPAIR Right 1992   pancreatis attack     pericarditis     ROTATOR CUFF REPAIR Right 2014    Allergies  Allergies  Allergen Reactions   Codeine Nausea And Vomiting    Other Nausea And Vomiting    Any narcotic    History of Present Illness    Jacob Le  is a 70 year old male with the above mention past medical history who presents today for evaluation of abnormal EKG.  Mr. Cremer was seen in office yesterday for surgical clearance.  During visit patient was doing well with no new cardiac complaints.  His blood pressure was well controlled.  EKG was completed here in office that showed prolonged QT interval and plan for reduction in Prilosec and recheck in 4 weeks with me.  Patient presented to his PCP yesterday and had EKG performed that was flagged as abnormal.    Since last being seen in the office patient reports.  Patient denies chest pain, palpitations, dyspnea, PND, orthopnea, nausea, vomiting, dizziness, syncope, edema, weight gain, or early satiety.  EKG was repeated and revealed chronic second-degree type I.  He remains asymptomatic and we discussed the need to wear an event monitor at his follow-up if needed.   Home Medications    Current Outpatient Medications  Medication Sig Dispense Refill   Coenzyme Q10 100 MG capsule Take 100 mg by mouth daily.     Multiple Vitamins-Minerals (CENTRUM SILVER 50+MEN) TABS Take 1 tablet by mouth daily.     omeprazole (PRILOSEC) 40 MG capsule Take 1 capsule (40 mg total) by mouth 2 (two) times daily before a meal. For 2 months then once daily 60 capsule 6  rosuvastatin (CRESTOR) 5 MG tablet TAKE 1 TABLET BY MOUTH EVERY DAY 90 tablet 1   sildenafil (VIAGRA) 100 MG tablet Take 0.5-1 tablets (50-100 mg total) by mouth daily as needed for erectile dysfunction. 5 tablet 11   No current facility-administered medications for this visit.     Review of Systems  Please see the history of present illness.    (+) Chronic arthritis of left knee ( All other systems reviewed and are otherwise negative except as noted above.  Physical Exam    Wt Readings from Last 3 Encounters:  08/02/22 208 lb (94.3 kg)   08/01/22 207 lb 12.8 oz (94.3 kg)  07/31/22 209 lb (94.8 kg)   VS: Vitals:   08/02/22 0913  BP: (!) 150/78  Pulse: 79  SpO2: 97%  ,Body mass index is 32.58 kg/m.  Constitutional:      Appearance: Healthy appearance. Not in distress.  Neck:     Vascular: JVD normal.  Pulmonary:     Effort: Pulmonary effort is normal.     Breath sounds: No wheezing. No rales. Diminished in the bases Cardiovascular:     Normal rate. Regular rhythm. Normal S1. Normal S2.      Murmurs: There is no murmur.  Edema:    Peripheral edema absent.  Abdominal:     Palpations: Abdomen is soft non tender. There is no hepatomegaly.  Skin:    General: Skin is warm and dry.  Neurological:     General: No focal deficit present.     Mental Status: Alert and oriented to person, place and time.     Cranial Nerves: Cranial nerves are intact.  EKG/LABS/Other Studies Reviewed    ECG personally reviewed by me today -sinus rhythm with second-degree AV block type I with QT interval of 374 and no acute changes compared to previous EKG.  Lab Results  Component Value Date   WBC 7.5 07/28/2022   HGB 15.4 07/28/2022   HCT 44.1 07/28/2022   MCV 88.4 07/28/2022   PLT 285 07/28/2022   Lab Results  Component Value Date   CREATININE 1.13 07/28/2022   BUN 19 07/28/2022   NA 141 07/28/2022   K 5.3 07/28/2022   CL 105 07/28/2022   CO2 27 07/28/2022   Lab Results  Component Value Date   ALT 32 07/28/2022   AST 25 07/28/2022   ALKPHOS 67 05/03/2022   BILITOT 1.4 (H) 07/28/2022   Lab Results  Component Value Date   CHOL 165 07/28/2022   HDL 50 07/28/2022   LDLCALC 90 07/28/2022   TRIG 151 (H) 07/28/2022   CHOLHDL 3.3 07/28/2022    Lab Results  Component Value Date   HGBA1C 6.3 (H) 07/28/2022    Assessment & Plan    1.  Second-degree type I AVB: -Patient is currently asymptomatic with current rhythm and reports no dizziness or presyncope -Patient should avoid beta-blockers -We will plan to place  event monitor to evaluate for prolonged pauses nocturnally at his follow-up on -Case discussed with our DOD Dr. Marlou Porch who agrees with EKG and recommends no further work-up at this time.  2.  History of CAD: -Can no charge-Today patient reports no chest pain or symptoms -Continue Crestor 5 mg daily  3.  Prolonged QT interval: -Patient EKG completed with QT interval of 537 ms -Today patient's QT interval is 393 -Patient will have repeat EKG in 4 weeks to rule out possible QT prolongation        Disposition: Follow-up with  Lauree Chandler, MD as scheduled    Medication Adjustments/Labs and Tests Ordered: Current medicines are reviewed at length with the patient today.  Concerns regarding medicines are outlined above.   Signed, Mable Fill, Marissa Nestle, NP 08/02/2022, 9:35 AM [facility]  Note:  This document was prepared using Dragon voice recognition software and may include unintentional dictation errors.

## 2022-08-02 ENCOUNTER — Ambulatory Visit: Payer: Medicare HMO | Attending: Nurse Practitioner | Admitting: Nurse Practitioner

## 2022-08-02 ENCOUNTER — Encounter: Payer: Self-pay | Admitting: Nurse Practitioner

## 2022-08-02 VITALS — BP 150/78 | HR 79 | Ht 67.0 in | Wt 208.0 lb

## 2022-08-02 DIAGNOSIS — R9431 Abnormal electrocardiogram [ECG] [EKG]: Secondary | ICD-10-CM

## 2022-08-02 DIAGNOSIS — I441 Atrioventricular block, second degree: Secondary | ICD-10-CM | POA: Diagnosis not present

## 2022-08-02 DIAGNOSIS — I2510 Atherosclerotic heart disease of native coronary artery without angina pectoris: Secondary | ICD-10-CM | POA: Diagnosis not present

## 2022-08-02 NOTE — Patient Instructions (Signed)
Medication Instructions:  Your physician recommends that you continue on your current medications as directed. Please refer to the Current Medication list given to you today.  *If you need a refill on your cardiac medications before your next appointment, please call your pharmacy*   Follow-Up: At [facility], you and your health needs are our priority.  As part of our continuing mission to provide you with exceptional heart care, we have created designated Provider Care Teams.  These Care Teams include your primary Cardiologist (physician) and Advanced Practice Providers (APPs -  Physician Assistants and Nurse Practitioners) who all work together to provide you with the care you need, when you need it.  We recommend signing up for the patient portal called "MyChart".  Sign up information is provided on this After Visit Summary.  MyChart is used to connect with patients for Virtual Visits (Telemedicine).  Patients are able to view lab/test results, encounter notes, upcoming appointments, etc.  Non-urgent messages can be sent to your provider as well.   To learn more about what you can do with MyChart, go to [email or web address].    Your next appointment:   1 year(s)  The format for your next appointment:   In Person  Provider:   Lauree Chandler, MD     Important Information About [place]

## 2022-08-03 NOTE — Telephone Encounter (Signed)
Faxed 26 pages of completed form along with labs and cardiology notes to Sophronia Simas at Dr Leona Carry office for surgery clearance [phone].

## 2022-08-08 DIAGNOSIS — I2510 Atherosclerotic heart disease of native coronary artery without angina pectoris: Secondary | ICD-10-CM | POA: Diagnosis not present

## 2022-08-08 DIAGNOSIS — E785 Hyperlipidemia, unspecified: Secondary | ICD-10-CM | POA: Diagnosis not present

## 2022-08-08 DIAGNOSIS — R030 Elevated blood-pressure reading, without diagnosis of hypertension: Secondary | ICD-10-CM | POA: Diagnosis not present

## 2022-08-08 DIAGNOSIS — N1831 Chronic kidney disease, stage 3a: Secondary | ICD-10-CM | POA: Diagnosis not present

## 2022-08-18 NOTE — Telephone Encounter (Signed)
Re-faxed surgery clearance to to Sophronia Simas [phone].   This message was sent via [place], a product from [employer] http://www.biscom.com/  [employer] pioneered [employer] and continues to innovate the most advanced and intelligent fax and secure messaging solutions for enterprises. www.biscom.com                     -------Fax Transmission Report-------  To:               Recipient at [phone] Subject:          Fw: Hp Scans Result:           The transmission was successful. Explanation:      All Pages Ok Pages Sent:       2 Connect Time:     1 minutes, 40 seconds Transmit Time:    08/18/2022 13:04 Transfer Rate:    14400 Status Code:      0000 Retry Count:      0 Job Id:           [id number] Unique Id:        [id number] Fax Line:         74 Fax Server:       [email or web address]

## 2022-08-24 ENCOUNTER — Telehealth: Payer: Self-pay | Admitting: Internal Medicine

## 2022-08-24 NOTE — Telephone Encounter (Signed)
Faxed for the 3rd time signed approval for surgery clearance to Sports Medicine Dr Ronnie Derby office [phone], phone [phone].  This message was sent via [place], a product from [employer] http://www.biscom.com/                    -------Fax Transmission Report-------  To:               Recipient at [phone] Subject:          FW: Hp Scans Result:           The transmission was successful. Explanation:      All Pages Ok Pages Sent:       3 Connect Time:     1 minutes, 35 seconds Transmit Time:    08/24/2022 11:00 Transfer Rate:    14400 Status Code:      0000 Retry Count:      0 Job Id:           [id number] Unique Id:        [id number] Fax Line:         49 Fax Server:       [email or web address]

## 2022-08-25 ENCOUNTER — Other Ambulatory Visit: Payer: Medicare HMO

## 2022-08-30 ENCOUNTER — Ambulatory Visit (INDEPENDENT_AMBULATORY_CARE_PROVIDER_SITE_OTHER): Payer: Medicare HMO | Admitting: Internal Medicine

## 2022-08-30 ENCOUNTER — Encounter: Payer: Self-pay | Admitting: Internal Medicine

## 2022-08-30 VITALS — BP 120/72 | HR 58 | Temp 98.6°F | Ht 67.0 in | Wt 211.1 lb

## 2022-08-30 DIAGNOSIS — Z01818 Encounter for other preprocedural examination: Secondary | ICD-10-CM

## 2022-08-30 DIAGNOSIS — Z0000 Encounter for general adult medical examination without abnormal findings: Secondary | ICD-10-CM

## 2022-08-30 DIAGNOSIS — E7800 Pure hypercholesterolemia, unspecified: Secondary | ICD-10-CM | POA: Diagnosis not present

## 2022-08-30 DIAGNOSIS — N1831 Chronic kidney disease, stage 3a: Secondary | ICD-10-CM

## 2022-08-30 DIAGNOSIS — Z9049 Acquired absence of other specified parts of digestive tract: Secondary | ICD-10-CM

## 2022-08-30 DIAGNOSIS — Z8679 Personal history of other diseases of the circulatory system: Secondary | ICD-10-CM | POA: Diagnosis not present

## 2022-08-30 DIAGNOSIS — Z8719 Personal history of other diseases of the digestive system: Secondary | ICD-10-CM | POA: Diagnosis not present

## 2022-08-30 LAB — POCT URINALYSIS DIPSTICK
Bilirubin, UA: NEGATIVE
Blood, UA: NEGATIVE
Glucose, UA: NEGATIVE
Ketones, UA: NEGATIVE
Leukocytes, UA: NEGATIVE
Nitrite, UA: NEGATIVE
Protein, UA: NEGATIVE
Spec Grav, UA: 1.02 (ref 1.010–1.025)
Urobilinogen, UA: 0.2 E.U./dL
pH, UA: 5 (ref 5.0–8.0)
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
## 2022-09-01 ENCOUNTER — Ambulatory Visit: Payer: Medicare HMO

## 2022-09-01 ENCOUNTER — Telehealth: Payer: Self-pay

## 2022-09-01 NOTE — Telephone Encounter (Signed)
Patient presented to office today for EKG nurse visit for prolonged QT on EKG from 10/16 per Nicholes Rough, PA. Discussed with Johann Capers and since patient had another EKG since then with normal QT interval, there is no need for repeat EKG at this time. Nurse visit cancelled.

## 2022-09-28 DIAGNOSIS — L578 Other skin changes due to chronic exposure to nonionizing radiation: Secondary | ICD-10-CM | POA: Diagnosis not present

## 2022-09-28 DIAGNOSIS — D225 Melanocytic nevi of trunk: Secondary | ICD-10-CM | POA: Diagnosis not present

## 2022-09-28 DIAGNOSIS — L821 Other seborrheic keratosis: Secondary | ICD-10-CM | POA: Diagnosis not present

## 2022-09-28 DIAGNOSIS — L409 Psoriasis, unspecified: Secondary | ICD-10-CM | POA: Diagnosis not present

## 2022-09-28 DIAGNOSIS — L812 Freckles: Secondary | ICD-10-CM | POA: Diagnosis not present

## 2022-09-28 DIAGNOSIS — L814 Other melanin hyperpigmentation: Secondary | ICD-10-CM | POA: Diagnosis not present

## 2022-09-28 DIAGNOSIS — D1801 Hemangioma of skin and subcutaneous tissue: Secondary | ICD-10-CM | POA: Diagnosis not present

## 2022-09-28 DIAGNOSIS — Z85828 Personal history of other malignant neoplasm of skin: Secondary | ICD-10-CM | POA: Diagnosis not present

## 2022-09-28 DIAGNOSIS — Z1283 Encounter for screening for malignant neoplasm of skin: Secondary | ICD-10-CM | POA: Diagnosis not present

## 2022-10-04 DIAGNOSIS — Z96652 Presence of left artificial knee joint: Secondary | ICD-10-CM | POA: Diagnosis not present

## 2022-10-04 DIAGNOSIS — M1712 Unilateral primary osteoarthritis, left knee: Secondary | ICD-10-CM | POA: Diagnosis not present

## 2022-10-12 DIAGNOSIS — Z7409 Other reduced mobility: Secondary | ICD-10-CM | POA: Diagnosis not present

## 2022-10-12 DIAGNOSIS — Z96652 Presence of left artificial knee joint: Secondary | ICD-10-CM | POA: Diagnosis not present

## 2022-10-12 DIAGNOSIS — M25462 Effusion, left knee: Secondary | ICD-10-CM | POA: Diagnosis not present

## 2022-10-12 DIAGNOSIS — M25662 Stiffness of left knee, not elsewhere classified: Secondary | ICD-10-CM | POA: Diagnosis not present

## 2022-10-12 DIAGNOSIS — R29898 Other symptoms and signs involving the musculoskeletal system: Secondary | ICD-10-CM | POA: Diagnosis not present

## 2022-10-16 DIAGNOSIS — M1712 Unilateral primary osteoarthritis, left knee: Secondary | ICD-10-CM | POA: Diagnosis not present

## 2022-10-16 DIAGNOSIS — Z96652 Presence of left artificial knee joint: Secondary | ICD-10-CM | POA: Diagnosis not present

## 2022-10-17 DIAGNOSIS — M25462 Effusion, left knee: Secondary | ICD-10-CM | POA: Diagnosis not present

## 2022-10-17 DIAGNOSIS — Z7409 Other reduced mobility: Secondary | ICD-10-CM | POA: Diagnosis not present

## 2022-10-17 DIAGNOSIS — M25662 Stiffness of left knee, not elsewhere classified: Secondary | ICD-10-CM | POA: Diagnosis not present

## 2022-10-17 DIAGNOSIS — R29898 Other symptoms and signs involving the musculoskeletal system: Secondary | ICD-10-CM | POA: Diagnosis not present

## 2022-10-17 DIAGNOSIS — Z96652 Presence of left artificial knee joint: Secondary | ICD-10-CM | POA: Diagnosis not present

## 2022-10-19 DIAGNOSIS — Z7409 Other reduced mobility: Secondary | ICD-10-CM | POA: Diagnosis not present

## 2022-10-19 DIAGNOSIS — M25662 Stiffness of left knee, not elsewhere classified: Secondary | ICD-10-CM | POA: Diagnosis not present

## 2022-10-19 DIAGNOSIS — M25462 Effusion, left knee: Secondary | ICD-10-CM | POA: Diagnosis not present

## 2022-10-19 DIAGNOSIS — R29898 Other symptoms and signs involving the musculoskeletal system: Secondary | ICD-10-CM | POA: Diagnosis not present

## 2022-10-19 DIAGNOSIS — Z96652 Presence of left artificial knee joint: Secondary | ICD-10-CM | POA: Diagnosis not present

## 2022-10-20 ENCOUNTER — Encounter: Payer: Self-pay | Admitting: Gastroenterology

## 2022-10-24 DIAGNOSIS — M25662 Stiffness of left knee, not elsewhere classified: Secondary | ICD-10-CM | POA: Diagnosis not present

## 2022-10-24 DIAGNOSIS — Z7409 Other reduced mobility: Secondary | ICD-10-CM | POA: Diagnosis not present

## 2022-10-24 DIAGNOSIS — R29898 Other symptoms and signs involving the musculoskeletal system: Secondary | ICD-10-CM | POA: Diagnosis not present

## 2022-10-24 DIAGNOSIS — M25462 Effusion, left knee: Secondary | ICD-10-CM | POA: Diagnosis not present

## 2022-10-24 DIAGNOSIS — Z96652 Presence of left artificial knee joint: Secondary | ICD-10-CM | POA: Diagnosis not present

## 2022-10-26 DIAGNOSIS — Z7409 Other reduced mobility: Secondary | ICD-10-CM | POA: Diagnosis not present

## 2022-10-26 DIAGNOSIS — M25662 Stiffness of left knee, not elsewhere classified: Secondary | ICD-10-CM | POA: Diagnosis not present

## 2022-10-26 DIAGNOSIS — R29898 Other symptoms and signs involving the musculoskeletal system: Secondary | ICD-10-CM | POA: Diagnosis not present

## 2022-10-26 DIAGNOSIS — Z96652 Presence of left artificial knee joint: Secondary | ICD-10-CM | POA: Diagnosis not present

## 2022-10-26 DIAGNOSIS — M25462 Effusion, left knee: Secondary | ICD-10-CM | POA: Diagnosis not present

## 2022-10-31 DIAGNOSIS — Z96652 Presence of left artificial knee joint: Secondary | ICD-10-CM | POA: Diagnosis not present

## 2022-10-31 DIAGNOSIS — M25662 Stiffness of left knee, not elsewhere classified: Secondary | ICD-10-CM | POA: Diagnosis not present

## 2022-10-31 DIAGNOSIS — Z7409 Other reduced mobility: Secondary | ICD-10-CM | POA: Diagnosis not present

## 2022-10-31 DIAGNOSIS — R29898 Other symptoms and signs involving the musculoskeletal system: Secondary | ICD-10-CM | POA: Diagnosis not present

## 2022-10-31 DIAGNOSIS — M25462 Effusion, left knee: Secondary | ICD-10-CM | POA: Diagnosis not present

## 2022-11-02 DIAGNOSIS — Z96652 Presence of left artificial knee joint: Secondary | ICD-10-CM | POA: Diagnosis not present

## 2022-11-02 DIAGNOSIS — M25662 Stiffness of left knee, not elsewhere classified: Secondary | ICD-10-CM | POA: Diagnosis not present

## 2022-11-02 DIAGNOSIS — M25462 Effusion, left knee: Secondary | ICD-10-CM | POA: Diagnosis not present

## 2022-11-02 DIAGNOSIS — Z7409 Other reduced mobility: Secondary | ICD-10-CM | POA: Diagnosis not present

## 2022-11-02 DIAGNOSIS — R29898 Other symptoms and signs involving the musculoskeletal system: Secondary | ICD-10-CM | POA: Diagnosis not present

## 2022-11-07 DIAGNOSIS — Z7409 Other reduced mobility: Secondary | ICD-10-CM | POA: Diagnosis not present

## 2022-11-07 DIAGNOSIS — Z96652 Presence of left artificial knee joint: Secondary | ICD-10-CM | POA: Diagnosis not present

## 2022-11-07 DIAGNOSIS — M25662 Stiffness of left knee, not elsewhere classified: Secondary | ICD-10-CM | POA: Diagnosis not present

## 2022-11-07 DIAGNOSIS — M25462 Effusion, left knee: Secondary | ICD-10-CM | POA: Diagnosis not present

## 2022-11-07 DIAGNOSIS — R29898 Other symptoms and signs involving the musculoskeletal system: Secondary | ICD-10-CM | POA: Diagnosis not present

## 2022-11-09 DIAGNOSIS — M25462 Effusion, left knee: Secondary | ICD-10-CM | POA: Diagnosis not present

## 2022-11-09 DIAGNOSIS — M25662 Stiffness of left knee, not elsewhere classified: Secondary | ICD-10-CM | POA: Diagnosis not present

## 2022-11-09 DIAGNOSIS — R29898 Other symptoms and signs involving the musculoskeletal system: Secondary | ICD-10-CM | POA: Diagnosis not present

## 2022-11-09 DIAGNOSIS — Z96652 Presence of left artificial knee joint: Secondary | ICD-10-CM | POA: Diagnosis not present

## 2022-11-09 DIAGNOSIS — Z7409 Other reduced mobility: Secondary | ICD-10-CM | POA: Diagnosis not present

## 2022-11-14 DIAGNOSIS — Z7409 Other reduced mobility: Secondary | ICD-10-CM | POA: Diagnosis not present

## 2022-11-14 DIAGNOSIS — Z96652 Presence of left artificial knee joint: Secondary | ICD-10-CM | POA: Diagnosis not present

## 2022-11-14 DIAGNOSIS — M25662 Stiffness of left knee, not elsewhere classified: Secondary | ICD-10-CM | POA: Diagnosis not present

## 2022-11-14 DIAGNOSIS — M25462 Effusion, left knee: Secondary | ICD-10-CM | POA: Diagnosis not present

## 2022-11-14 DIAGNOSIS — R29898 Other symptoms and signs involving the musculoskeletal system: Secondary | ICD-10-CM | POA: Diagnosis not present

## 2022-11-16 DIAGNOSIS — Z96652 Presence of left artificial knee joint: Secondary | ICD-10-CM | POA: Diagnosis not present

## 2022-11-16 DIAGNOSIS — Z7409 Other reduced mobility: Secondary | ICD-10-CM | POA: Diagnosis not present

## 2022-11-16 DIAGNOSIS — M25662 Stiffness of left knee, not elsewhere classified: Secondary | ICD-10-CM | POA: Diagnosis not present

## 2022-11-16 DIAGNOSIS — R29898 Other symptoms and signs involving the musculoskeletal system: Secondary | ICD-10-CM | POA: Diagnosis not present

## 2022-11-16 DIAGNOSIS — M25462 Effusion, left knee: Secondary | ICD-10-CM | POA: Diagnosis not present

## 2022-11-23 DIAGNOSIS — M25662 Stiffness of left knee, not elsewhere classified: Secondary | ICD-10-CM | POA: Diagnosis not present

## 2022-11-23 DIAGNOSIS — Z96652 Presence of left artificial knee joint: Secondary | ICD-10-CM | POA: Diagnosis not present

## 2022-11-23 DIAGNOSIS — Z7409 Other reduced mobility: Secondary | ICD-10-CM | POA: Diagnosis not present

## 2022-11-23 DIAGNOSIS — R29898 Other symptoms and signs involving the musculoskeletal system: Secondary | ICD-10-CM | POA: Diagnosis not present

## 2022-11-23 DIAGNOSIS — M25462 Effusion, left knee: Secondary | ICD-10-CM | POA: Diagnosis not present

## 2022-11-29 DIAGNOSIS — Z7409 Other reduced mobility: Secondary | ICD-10-CM | POA: Diagnosis not present

## 2022-11-29 DIAGNOSIS — R29898 Other symptoms and signs involving the musculoskeletal system: Secondary | ICD-10-CM | POA: Diagnosis not present

## 2022-11-29 DIAGNOSIS — Z96652 Presence of left artificial knee joint: Secondary | ICD-10-CM | POA: Diagnosis not present

## 2022-11-29 DIAGNOSIS — M25462 Effusion, left knee: Secondary | ICD-10-CM | POA: Diagnosis not present

## 2022-11-29 DIAGNOSIS — M25662 Stiffness of left knee, not elsewhere classified: Secondary | ICD-10-CM | POA: Diagnosis not present

## 2022-12-06 DIAGNOSIS — Z96652 Presence of left artificial knee joint: Secondary | ICD-10-CM | POA: Diagnosis not present

## 2022-12-06 DIAGNOSIS — M25662 Stiffness of left knee, not elsewhere classified: Secondary | ICD-10-CM | POA: Diagnosis not present

## 2022-12-06 DIAGNOSIS — M25462 Effusion, left knee: Secondary | ICD-10-CM | POA: Diagnosis not present

## 2022-12-06 DIAGNOSIS — Z7409 Other reduced mobility: Secondary | ICD-10-CM | POA: Diagnosis not present

## 2022-12-06 DIAGNOSIS — R29898 Other symptoms and signs involving the musculoskeletal system: Secondary | ICD-10-CM | POA: Diagnosis not present

## 2022-12-13 DIAGNOSIS — Z96652 Presence of left artificial knee joint: Secondary | ICD-10-CM | POA: Diagnosis not present

## 2022-12-13 DIAGNOSIS — R29898 Other symptoms and signs involving the musculoskeletal system: Secondary | ICD-10-CM | POA: Diagnosis not present

## 2022-12-13 DIAGNOSIS — Z7409 Other reduced mobility: Secondary | ICD-10-CM | POA: Diagnosis not present

## 2022-12-13 DIAGNOSIS — M25462 Effusion, left knee: Secondary | ICD-10-CM | POA: Diagnosis not present

## 2022-12-13 DIAGNOSIS — M25662 Stiffness of left knee, not elsewhere classified: Secondary | ICD-10-CM | POA: Diagnosis not present

## 2022-12-21 DIAGNOSIS — Z96652 Presence of left artificial knee joint: Secondary | ICD-10-CM | POA: Diagnosis not present

## 2023-02-07 DIAGNOSIS — M9901 Segmental and somatic dysfunction of cervical region: Secondary | ICD-10-CM | POA: Diagnosis not present

## 2023-02-07 DIAGNOSIS — M9908 Segmental and somatic dysfunction of rib cage: Secondary | ICD-10-CM | POA: Diagnosis not present

## 2023-02-07 DIAGNOSIS — M9902 Segmental and somatic dysfunction of thoracic region: Secondary | ICD-10-CM | POA: Diagnosis not present

## 2023-02-07 DIAGNOSIS — G243 Spasmodic torticollis: Secondary | ICD-10-CM | POA: Diagnosis not present

## 2023-03-08 ENCOUNTER — Ambulatory Visit (INDEPENDENT_AMBULATORY_CARE_PROVIDER_SITE_OTHER): Payer: Medicare HMO | Admitting: Internal Medicine

## 2023-03-08 ENCOUNTER — Telehealth: Payer: Self-pay | Admitting: Internal Medicine

## 2023-03-08 ENCOUNTER — Encounter: Payer: Self-pay | Admitting: Internal Medicine

## 2023-03-08 VITALS — BP 140/70 | HR 53 | Temp 98.5°F | Ht 67.0 in | Wt 212.0 lb

## 2023-03-08 DIAGNOSIS — R609 Edema, unspecified: Secondary | ICD-10-CM | POA: Diagnosis not present

## 2023-03-08 MED ORDER — TRIAMTERENE-HCTZ 75-50 MG PO TABS: 1.0000 | ORAL_TABLET | Freq: Every day | ORAL | 0 refills | Status: DC

## 2023-03-08 NOTE — Progress Notes (Signed)
Patient Care Team: Margaree Mackintosh, MD as PCP - General (Internal Medicine) Kathleene Hazel, MD as PCP - Cardiology (Cardiology)  Visit Date: 03/08/23  Subjective:    Patient ID: Jacob Le , Male   DOB: [birth year], 72 y.o.    MRN: [id number]   72 y.o. Male presents today for swelling in feet/ankles, fatigue. Status post left knee replacement 09/2023. Noticed swelling after this but it is worse now and was especially bad on 03/01/23. Was on his feet most of the day that day and weather was hot. History of CAD, second-degree type I AVB, prolonged QT interval. Followed by [facility].  Past Medical History:  Diagnosis Date   Chronic kidney disease    stage 3   Dysrhythmia    2nd degree heart block, Type 1   H/O nasal septoplasty 1996   Hyperlipidemia    on meds   PONV (postoperative nausea and vomiting)    Sleep apnea    without CPAP   Squamous cell cancer of external ear, left 2018     Family History  Problem Relation Age of Onset   Cancer Mother    Colon cancer Neg Hx    Colon polyps Neg Hx    Esophageal cancer Neg Hx    Prostate cancer Neg Hx    Rectal cancer Neg Hx    Stomach cancer Neg Hx     Social History   Social History Narrative   Not on file      Review of Systems  Constitutional:  Negative for fever and malaise/fatigue.  HENT:  Negative for congestion.   Eyes:  Negative for blurred vision.  Respiratory:  Negative for cough and shortness of breath.   Cardiovascular:  Positive for leg swelling. Negative for chest pain and palpitations.  Gastrointestinal:  Negative for vomiting.  Musculoskeletal:  Negative for back pain.  Skin:  Negative for rash.  Neurological:  Negative for loss of consciousness and headaches.        Objective:   Vitals: BP (!) 140/70   Pulse (!) 53   Temp 98.5 F (36.9 C) (Tympanic)   Ht 5\' 7"  (1.702 m)   Wt 212 lb (96.2 kg)   SpO2 96%   BMI 33.20 kg/m    Physical Exam Vitals and nursing note  reviewed.  Constitutional:      General: He is not in acute distress.    Appearance: Normal appearance. He is not ill-appearing.  HENT:     Head: Normocephalic and atraumatic.  Cardiovascular:     Rate and Rhythm: Normal rate and regular rhythm.     Pulses: Normal pulses.     Heart sounds: Normal heart sounds. No murmur heard.    No friction rub. No gallop.     Comments: Pitting edema left lower extremity. Trace edema right lower extremity. Pulmonary:     Effort: Pulmonary effort is normal. No respiratory distress.     Breath sounds: Normal breath sounds. No wheezing or rales.  Musculoskeletal:     Right lower leg: Edema present.     Left lower leg: 1+ Edema present.  Skin:    General: Skin is warm and dry.  Neurological:     Mental Status: He is alert and oriented to person, place, and time. Mental status is at baseline.  Psychiatric:        Mood and Affect: Mood normal.        Behavior: Behavior normal.  Thought Content: Thought content normal.        Judgment: Judgment normal.       Results:   Studies obtained and personally reviewed by me:   Labs:       Component Value Date/Time   NA 141 07/28/2022 1019   K 5.3 07/28/2022 1019   CL 105 07/28/2022 1019   CO2 27 07/28/2022 1019   GLUCOSE 105 (H) 07/28/2022 1019   BUN 19 07/28/2022 1019   CREATININE 1.13 07/28/2022 1019   CALCIUM 10.1 07/28/2022 1019   PROT 6.8 07/28/2022 1019   PROT 7.4 04/10/2018 1217   ALBUMIN 4.9 05/03/2022 0956   ALBUMIN 4.9 (H) 04/10/2018 1217   AST 25 07/28/2022 1019   ALT 32 07/28/2022 1019   ALKPHOS 67 05/03/2022 0956   BILITOT 1.4 (H) 07/28/2022 1019   BILITOT 1.9 (H) 04/10/2018 1217   GFRNONAA >60 02/27/2022 0943   GFRNONAA 51 (L) 05/25/2020 1155   GFRAA 59 (L) 05/25/2020 1155     Lab Results  Component Value Date   WBC 7.5 07/28/2022   HGB 15.4 07/28/2022   HCT 44.1 07/28/2022   MCV 88.4 07/28/2022   PLT 285 07/28/2022    Lab Results  Component Value Date    CHOL 165 07/28/2022   HDL 50 07/28/2022   LDLCALC 90 07/28/2022   TRIG 151 (H) 07/28/2022   CHOLHDL 3.3 07/28/2022    Lab Results  Component Value Date   HGBA1C 6.3 (H) 07/28/2022     Lab Results  Component Value Date   TSH 1.950 12/18/2017     Lab Results  Component Value Date   PSA 1.42 07/28/2022   PSA 1.00 08/01/2021   PSA 2.6 05/25/2020      Assessment & Plan:   Lower extremity edema: prescribed hydrochlorothiazide-triamterene 75-50 mg daily.  Return in 3 months for BMET and office visit.    I,Alexander Ruley,acting as a Neurosurgeon for Margaree Mackintosh, MD.,have documented all relevant documentation on the behalf of Margaree Mackintosh, MD,as directed by  Margaree Mackintosh, MD while in the presence of Margaree Mackintosh, MD.   I, Margaree Mackintosh, MD, have reviewed all documentation for this visit. The documentation on 03/16/23 for the exam, diagnosis, procedures, and orders are all accurate and complete.

## 2023-03-08 NOTE — Telephone Encounter (Signed)
Jacob Le [phone]  Sam called to say he has swollen feet and ankles plus fatigue for the last few weeks and would like to come in and be seen.

## 2023-03-09 ENCOUNTER — Other Ambulatory Visit: Payer: Self-pay | Admitting: Internal Medicine

## 2023-03-11 ENCOUNTER — Other Ambulatory Visit: Payer: Self-pay | Admitting: Gastroenterology

## 2023-03-16 NOTE — Patient Instructions (Addendum)
You have been diagnosed with dependent edema likely due to venous insufficiency.  Please try HCTZ-triamterene 75/50 (Maxide) daily and return in 3 months for basic metabolic panel and office visit.  Keep legs elevated in the evenings if possible.

## 2023-03-29 NOTE — Progress Notes (Signed)
Patient Care Team: Margaree Mackintosh, MD as PCP - General (Internal Medicine) Kathleene Hazel, MD as PCP - Cardiology (Cardiology)  Visit Date: 04/05/23  Subjective:    Patient ID: Jacob Le , Male   DOB: April 26, 1951, 72 y.o.    MRN: [id number]   72 y.o. Male presents today for a follow-up regarding lower extremity swelling. Hydration is adequate. Denies urinary symptoms. BUN elevated at 28. Creatinine at 1.47. Has had occasional dizzy spells since starting Maxzide.  Past Medical History:  Diagnosis Date   Chronic kidney disease    stage 3   Dysrhythmia    2nd degree heart block, Type 1   H/O nasal septoplasty 1996   Hyperlipidemia    on meds   PONV (postoperative nausea and vomiting)    Sleep apnea    without CPAP   Squamous cell cancer of external ear, left 2018     Family History  Problem Relation Age of Onset   Cancer Mother    Colon cancer Neg Hx    Colon polyps Neg Hx    Esophageal cancer Neg Hx    Prostate cancer Neg Hx    Rectal cancer Neg Hx    Stomach cancer Neg Hx     Social History   Social History Narrative   Not on file      Review of Systems  Constitutional:  Negative for fever and malaise/fatigue.  HENT:  Negative for congestion.   Eyes:  Negative for blurred vision.  Respiratory:  Negative for cough and shortness of breath.   Cardiovascular:  Negative for chest pain, palpitations and leg swelling.  Gastrointestinal:  Negative for vomiting.  Musculoskeletal:  Negative for back pain.  Skin:  Negative for rash.  Neurological:  Positive for dizziness. Negative for loss of consciousness and headaches.        Objective:   Vitals: BP 132/76   Pulse 88   Temp 97.8 F (36.6 C) (Tympanic)   Resp 16   Ht 5\' 7"  (1.702 m)   Wt 206 lb 4 oz (93.6 kg)   SpO2 98%   BMI 32.30 kg/m    Physical Exam Constitutional:      General: He is not in acute distress.    Appearance: Normal appearance. He is not ill-appearing.  HENT:      Head: Normocephalic and atraumatic.  Cardiovascular:     Rate and Rhythm: Normal rate and regular rhythm.     Pulses: Normal pulses.     Heart sounds: Normal heart sounds. No murmur heard.    No friction rub. No gallop.     Comments: Occasional irregular beat. Pulmonary:     Effort: Pulmonary effort is normal. No respiratory distress.     Breath sounds: Normal breath sounds. No wheezing or rales.  Skin:    General: Skin is warm and dry.  Neurological:     Mental Status: He is alert and oriented to person, place, and time. Mental status is at baseline.  Psychiatric:        Mood and Affect: Mood normal.        Behavior: Behavior normal.        Thought Content: Thought content normal.        Judgment: Judgment normal.       Results:   Studies obtained and personally reviewed by me:   Labs:       Component Value Date/Time   NA 139 04/03/2023 1124   K 4.7 04/03/2023  1124   CL 103 04/03/2023 1124   CO2 29 04/03/2023 1124   GLUCOSE 99 04/03/2023 1124   BUN 28 (H) 04/03/2023 1124   CREATININE 1.47 (H) 04/03/2023 1124   CALCIUM 10.2 04/03/2023 1124   PROT 6.8 07/28/2022 1019   PROT 7.4 04/10/2018 1217   ALBUMIN 4.9 05/03/2022 0956   ALBUMIN 4.9 (H) 04/10/2018 1217   AST 25 07/28/2022 1019   ALT 32 07/28/2022 1019   ALKPHOS 67 05/03/2022 0956   BILITOT 1.4 (H) 07/28/2022 1019   BILITOT 1.9 (H) 04/10/2018 1217   GFRNONAA >60 02/27/2022 0943   GFRNONAA 51 (L) 05/25/2020 1155   GFRAA 59 (L) 05/25/2020 1155     Lab Results  Component Value Date   WBC 7.5 07/28/2022   HGB 15.4 07/28/2022   HCT 44.1 07/28/2022   MCV 88.4 07/28/2022   PLT 285 07/28/2022    Lab Results  Component Value Date   CHOL 165 07/28/2022   HDL 50 07/28/2022   LDLCALC 90 07/28/2022   TRIG 151 (H) 07/28/2022   CHOLHDL 3.3 07/28/2022    Lab Results  Component Value Date   HGBA1C 6.3 (H) 07/28/2022     Lab Results  Component Value Date   TSH 1.950 12/18/2017     Lab Results   Component Value Date   PSA 1.42 07/28/2022   PSA 1.00 08/01/2021   PSA 2.6 05/25/2020      Assessment & Plan:   Dependent edema: kidney functions elevated. Decrease Maxzide to 37.5-25 mg daily.  His creatinine has increased from 1.13 in October to 1.47.  This could be due to diuretic and therefore we will decrease dose from Maxide 75 to Maxide 37.5 with follow-up soon.  He may be developing some chronic kidney disease.  Impaired glucose tolerance-continue to monitor.  Hemoglobin A1c was 6.3% 8 months ago and will be checked at physical exam  Return in 2 weeks for BMET.    I,Alexander Ruley,acting as a Neurosurgeon for Margaree Mackintosh, MD.,have documented all relevant documentation on the behalf of Margaree Mackintosh, MD,as directed by  Margaree Mackintosh, MD while in the presence of Margaree Mackintosh, MD.   I, Margaree Mackintosh, MD, have reviewed all documentation for this visit. The documentation on 04/14/23 for the exam, diagnosis, procedures, and orders are all accurate and complete.

## 2023-04-03 ENCOUNTER — Other Ambulatory Visit: Payer: Medicare HMO

## 2023-04-03 DIAGNOSIS — R609 Edema, unspecified: Secondary | ICD-10-CM | POA: Diagnosis not present

## 2023-04-03 LAB — BASIC METABOLIC PANEL
BUN/Creatinine Ratio: 19 (calc) (ref 6–22)
BUN: 28 mg/dL — ABNORMAL HIGH (ref 7–25)
CO2: 29 mmol/L (ref 20–32)
Calcium: 10.2 mg/dL (ref 8.6–10.3)
Chloride: 103 mmol/L (ref 98–110)
Creat: 1.47 mg/dL — ABNORMAL HIGH (ref 0.70–1.28)
Glucose, Bld: 99 mg/dL (ref 65–99)
Potassium: 4.7 mmol/L (ref 3.5–5.3)
Sodium: 139 mmol/L (ref 135–146)

## 2023-04-04 DIAGNOSIS — R69 Illness, unspecified: Secondary | ICD-10-CM | POA: Diagnosis not present

## 2023-04-05 ENCOUNTER — Encounter: Payer: Self-pay | Admitting: Internal Medicine

## 2023-04-05 ENCOUNTER — Ambulatory Visit (INDEPENDENT_AMBULATORY_CARE_PROVIDER_SITE_OTHER): Payer: Medicare HMO | Admitting: Internal Medicine

## 2023-04-05 VITALS — BP 132/76 | HR 88 | Temp 97.8°F | Resp 16 | Ht 67.0 in | Wt 206.2 lb

## 2023-04-05 DIAGNOSIS — E7439 Other disorders of intestinal carbohydrate absorption: Secondary | ICD-10-CM

## 2023-04-05 DIAGNOSIS — R609 Edema, unspecified: Secondary | ICD-10-CM

## 2023-04-05 DIAGNOSIS — R7989 Other specified abnormal findings of blood chemistry: Secondary | ICD-10-CM

## 2023-04-05 MED ORDER — TRIAMTERENE-HCTZ 37.5-25 MG PO TABS: 1.0000 | ORAL_TABLET | Freq: Every day | ORAL | 0 refills | Status: DC

## 2023-04-14 NOTE — Patient Instructions (Addendum)
Continue to watch diet and continue with exercise regimen.  We are decreasing Maxide dosage to 37.5 mg daily with follow-up in 2 weeks.  Hemoglobin A1c was 6.3% 8 months ago will be checked at time of physical exam.

## 2023-04-16 ENCOUNTER — Other Ambulatory Visit: Payer: Medicare HMO

## 2023-04-16 DIAGNOSIS — R799 Abnormal finding of blood chemistry, unspecified: Secondary | ICD-10-CM

## 2023-04-16 DIAGNOSIS — R7989 Other specified abnormal findings of blood chemistry: Secondary | ICD-10-CM

## 2023-04-16 LAB — BASIC METABOLIC PANEL
BUN/Creatinine Ratio: 20 (calc) (ref 6–22)
BUN: 26 mg/dL — ABNORMAL HIGH (ref 7–25)
CO2: 27 mmol/L (ref 20–32)
Calcium: 9.2 mg/dL (ref 8.6–10.3)
Chloride: 102 mmol/L (ref 98–110)
Creat: 1.33 mg/dL — ABNORMAL HIGH (ref 0.70–1.28)
Glucose, Bld: 156 mg/dL — ABNORMAL HIGH (ref 65–99)
Potassium: 4.8 mmol/L (ref 3.5–5.3)
Sodium: 136 mmol/L (ref 135–146)

## 2023-04-27 ENCOUNTER — Other Ambulatory Visit: Payer: Self-pay | Admitting: Internal Medicine

## 2023-07-26 ENCOUNTER — Other Ambulatory Visit: Payer: Self-pay | Admitting: Internal Medicine

## 2023-08-24 ENCOUNTER — Other Ambulatory Visit: Payer: Self-pay | Admitting: Family
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
## 2023-08-30 ENCOUNTER — Telehealth: Payer: Self-pay | Admitting: Internal Medicine

## 2023-08-30 NOTE — Telephone Encounter (Signed)
Patient wanted to know if he needed to fast for his labs result he would like a call back regarding this

## 2023-08-31 ENCOUNTER — Other Ambulatory Visit: Payer: Medicare HMO

## 2023-08-31 DIAGNOSIS — E7439 Other disorders of intestinal carbohydrate absorption: Secondary | ICD-10-CM

## 2023-08-31 DIAGNOSIS — E7800 Pure hypercholesterolemia, unspecified: Secondary | ICD-10-CM

## 2023-08-31 DIAGNOSIS — Z125 Encounter for screening for malignant neoplasm of prostate: Secondary | ICD-10-CM

## 2023-08-31 DIAGNOSIS — N1831 Chronic kidney disease, stage 3a: Secondary | ICD-10-CM

## 2023-08-31 DIAGNOSIS — Z0000 Encounter for general adult medical examination without abnormal findings: Secondary | ICD-10-CM

## 2023-09-01 LAB — COMPLETE METABOLIC PANEL WITH GFR
AG Ratio: 2.4 (calc) (ref 1.0–2.5)
ALT: 30 U/L (ref 9–46)
AST: 24 U/L (ref 10–35)
Albumin: 5 g/dL (ref 3.6–5.1)
Alkaline phosphatase (APISO): 66 U/L (ref 35–144)
BUN/Creatinine Ratio: 20 (calc) (ref 6–22)
BUN: 27 mg/dL — ABNORMAL HIGH (ref 7–25)
CO2: 27 mmol/L (ref 20–32)
Calcium: 10.1 mg/dL (ref 8.6–10.3)
Chloride: 105 mmol/L (ref 98–110)
Creat: 1.33 mg/dL — ABNORMAL HIGH (ref 0.70–1.28)
Globulin: 2.1 g/dL (ref 1.9–3.7)
Glucose, Bld: 114 mg/dL — ABNORMAL HIGH (ref 65–99)
Potassium: 5.2 mmol/L (ref 3.5–5.3)
Sodium: 140 mmol/L (ref 135–146)
Total Bilirubin: 1 mg/dL (ref 0.2–1.2)
Total Protein: 7.1 g/dL (ref 6.1–8.1)
eGFR: 56 mL/min/{1.73_m2} — ABNORMAL LOW (ref >=60)

## 2023-09-01 LAB — LIPID PANEL
Cholesterol: 141 mg/dL (ref <200)
HDL: 50 mg/dL (ref >=40)
LDL Cholesterol (Calc): 74 mg/dL
Non-HDL Cholesterol (Calc): 91 mg/dL (ref <130)
Total CHOL/HDL Ratio: 2.8 (calc) (ref <5.0)
Triglycerides: 88 mg/dL (ref <150)

## 2023-09-01 LAB — CBC WITH DIFFERENTIAL/PLATELET
Absolute Lymphocytes: 1879 {cells}/uL (ref 850–3900)
Absolute Monocytes: 518 {cells}/uL (ref 200–950)
Basophils Absolute: 49 {cells}/uL (ref 0–200)
Basophils Relative: 0.9 %
Eosinophils Absolute: 211 {cells}/uL (ref 15–500)
Eosinophils Relative: 3.9 %
HCT: 45.4 % (ref 38.5–50.0)
Hemoglobin: 15.4 g/dL (ref 13.2–17.1)
MCH: 30.9 pg (ref 27.0–33.0)
MCHC: 33.9 g/dL (ref 32.0–36.0)
MCV: 91 fL (ref 80.0–100.0)
MPV: 10.3 fL (ref 7.5–12.5)
Monocytes Relative: 9.6 %
Neutro Abs: 2743 {cells}/uL (ref 1500–7800)
Neutrophils Relative %: 50.8 %
Platelets: 280 10*3/uL (ref 140–400)
RBC: 4.99 10*6/uL (ref 4.20–5.80)
RDW: 12.3 % (ref 11.0–15.0)
Total Lymphocyte: 34.8 %
WBC: 5.4 10*3/uL (ref 3.8–10.8)

## 2023-09-01 LAB — MICROALBUMIN / CREATININE URINE RATIO
Creatinine, Urine: 149 mg/dL (ref 20–320)
Microalb Creat Ratio: 4 mg/g{creat} (ref <30)
Microalb, Ur: 0.6 mg/dL

## 2023-09-01 LAB — HEMOGLOBIN A1C
Hgb A1c MFr Bld: 6.1 %{Hb} — ABNORMAL HIGH (ref <5.7)
Mean Plasma Glucose: 128 mg/dL
eAG (mmol/L): 7.1 mmol/L

## 2023-09-01 LAB — PSA: PSA: 1.99 ng/mL (ref <=4.00)

## 2023-09-03 ENCOUNTER — Ambulatory Visit (INDEPENDENT_AMBULATORY_CARE_PROVIDER_SITE_OTHER): Payer: Medicare HMO | Admitting: Internal Medicine

## 2023-09-03 ENCOUNTER — Encounter: Payer: Self-pay | Admitting: Internal Medicine

## 2023-09-03 VITALS — BP 120/70 | HR 57 | Ht 67.0 in | Wt 208.0 lb

## 2023-09-03 DIAGNOSIS — R609 Edema, unspecified: Secondary | ICD-10-CM

## 2023-09-03 DIAGNOSIS — N521 Erectile dysfunction due to diseases classified elsewhere: Secondary | ICD-10-CM

## 2023-09-03 DIAGNOSIS — E7800 Pure hypercholesterolemia, unspecified: Secondary | ICD-10-CM

## 2023-09-03 DIAGNOSIS — E1169 Type 2 diabetes mellitus with other specified complication: Secondary | ICD-10-CM

## 2023-09-03 DIAGNOSIS — N1831 Chronic kidney disease, stage 3a: Secondary | ICD-10-CM

## 2023-09-03 DIAGNOSIS — Z9049 Acquired absence of other specified parts of digestive tract: Secondary | ICD-10-CM | POA: Diagnosis not present

## 2023-09-03 DIAGNOSIS — Z8719 Personal history of other diseases of the digestive system: Secondary | ICD-10-CM | POA: Diagnosis not present

## 2023-09-03 DIAGNOSIS — Z8679 Personal history of other diseases of the circulatory system: Secondary | ICD-10-CM

## 2023-09-03 DIAGNOSIS — K219 Gastro-esophageal reflux disease without esophagitis: Secondary | ICD-10-CM

## 2023-09-03 DIAGNOSIS — E7439 Other disorders of intestinal carbohydrate absorption: Secondary | ICD-10-CM

## 2023-09-03 DIAGNOSIS — Z0000 Encounter for general adult medical examination without abnormal findings: Secondary | ICD-10-CM

## 2023-09-03 MED ORDER — LEVOFLOXACIN 500 MG PO TABS: 500.0000 mg | ORAL_TABLET | Freq: Every day | ORAL | 0 refills | Status: DC

## 2023-09-03 MED ORDER — SILDENAFIL CITRATE 100 MG PO TABS: 50.0000 mg | ORAL_TABLET | Freq: Every day | ORAL | 11 refills | Status: AC | PRN

## 2023-09-15 NOTE — Patient Instructions (Addendum)
As always it was a pleasure to see you today.  No change in medications.  Glucose control is stable at 6.1% down from 6.3% 1 year ago.  Please continue to be seen at [facility] for stage IIIa chronic kidney disease.  Have prescribed Levaquin for upcoming trip to have on hand for possible illness.  Colonoscopy is up-to-date.  Discussed vaccines.  Follow-up in 6 months.

## 2023-10-07 DIAGNOSIS — R69 Illness, unspecified: Secondary | ICD-10-CM | POA: Diagnosis not present

## 2023-11-01 DIAGNOSIS — Z96652 Presence of left artificial knee joint: Secondary | ICD-10-CM | POA: Diagnosis not present

## 2024-01-02 DIAGNOSIS — H2512 Age-related nuclear cataract, left eye: Secondary | ICD-10-CM | POA: Diagnosis not present

## 2024-01-02 DIAGNOSIS — H5213 Myopia, bilateral: Secondary | ICD-10-CM | POA: Diagnosis not present

## 2024-01-02 DIAGNOSIS — H21233 Degeneration of iris (pigmentary), bilateral: Secondary | ICD-10-CM | POA: Diagnosis not present

## 2024-01-02 DIAGNOSIS — H25811 Combined forms of age-related cataract, right eye: Secondary | ICD-10-CM | POA: Diagnosis not present

## 2024-01-02 DIAGNOSIS — H40013 Open angle with borderline findings, low risk, bilateral: Secondary | ICD-10-CM | POA: Diagnosis not present

## 2024-01-02 DIAGNOSIS — H52223 Regular astigmatism, bilateral: Secondary | ICD-10-CM | POA: Diagnosis not present

## 2024-01-02 DIAGNOSIS — H524 Presbyopia: Secondary | ICD-10-CM | POA: Diagnosis not present

## 2024-01-15 ENCOUNTER — Ambulatory Visit: Payer: Medicare HMO | Attending: Physician Assistant | Admitting: Physician Assistant

## 2024-01-15 ENCOUNTER — Encounter: Payer: Self-pay | Admitting: Physician Assistant

## 2024-01-15 ENCOUNTER — Ambulatory Visit: Attending: Physician Assistant

## 2024-01-15 VITALS — BP 118/58 | Ht 68.0 in | Wt 208.8 lb

## 2024-01-15 DIAGNOSIS — I2510 Atherosclerotic heart disease of native coronary artery without angina pectoris: Secondary | ICD-10-CM | POA: Diagnosis not present

## 2024-01-15 DIAGNOSIS — R001 Bradycardia, unspecified: Secondary | ICD-10-CM

## 2024-01-15 DIAGNOSIS — I340 Nonrheumatic mitral (valve) insufficiency: Secondary | ICD-10-CM

## 2024-01-15 DIAGNOSIS — I441 Atrioventricular block, second degree: Secondary | ICD-10-CM

## 2024-01-15 DIAGNOSIS — E785 Hyperlipidemia, unspecified: Secondary | ICD-10-CM

## 2024-01-15 NOTE — Progress Notes (Unsigned)
 Enrolled patient for a 7 day Zio XT monitor to be mailed to patients home   [place] to read

## 2024-01-15 NOTE — Patient Instructions (Signed)
 Medication Instructions:  Your physician recommends that you continue on your current medications as directed. Please refer to the Current Medication list given to you today.  *If you need a refill on your cardiac medications before your next appointment, please call your pharmacy*  Lab Work: None ordered  If you have labs (blood work) drawn today and your tests are completely normal, you will receive your results only by: MyChart Message (if you have MyChart) OR A paper copy in the mail If you have any lab test that is abnormal or we need to change your treatment, we will call you to review the results.  Testing/Procedures: Your physician has requested that you have an echocardiogram. Echocardiography is a painless test that uses sound waves to create images of your heart. It provides your doctor with information about the size and shape of your heart and how well your heart's chambers and valves are working. This procedure takes approximately one hour. There are no restrictions for this procedure. Please do NOT wear cologne, perfume, aftershave, or lotions (deodorant is allowed). Please arrive 15 minutes prior to your appointment time.  Please note: We ask at that you not bring children with you during ultrasound (echo/ vascular) testing. Due to room size and safety concerns, children are not allowed in the ultrasound rooms during exams. Our front office staff cannot provide observation of children in our lobby area while testing is being conducted. An adult accompanying a patient to their appointment will only be allowed in the ultrasound room at the discretion of the ultrasound technician under special circumstances. We apologize for any inconvenience.   ZIO XT- Long Term Monitor Instructions  Your physician has requested you wear a ZIO patch monitor for 7 days.  This is a single patch monitor. [employer] supplies one patch monitor per enrollment. Additional stickers are not available.  Please do not apply patch if you will be having a Nuclear Stress Test,  Echocardiogram, Cardiac CT, MRI, or Chest Xray during the period you would be wearing the  monitor. The patch cannot be worn during these tests. You cannot remove and re-apply the  ZIO XT patch monitor.  Your ZIO patch monitor will be mailed 3 day USPS to your address on file. It may take 3-5 days  to receive your monitor after you have been enrolled.  Once you have received your monitor, please review the enclosed instructions. Your monitor  has already been registered assigning a specific monitor serial # to you.  Billing and Patient Assistance Program Information  We have supplied [employer] with any of your insurance information on file for billing purposes. [employer] offers a sliding scale Patient Assistance Program for patients that do not have  insurance, or whose insurance does not completely cover the cost of the ZIO monitor.  You must apply for the Patient Assistance Program to qualify for this discounted rate.  To apply, please call [employer] at [phone], select option 4, select option 2, ask to apply for  Patient Assistance Program. Meredeth Ide will ask your household income, and how many people  are in your household. They will quote your out-of-pocket cost based on that information.  [employer] will also be able to set up a 27-month, interest-free payment plan if needed.  Applying the monitor   Shave hair from upper left chest.  Hold abrader disc by orange tab. Rub abrader in 40 strokes over the upper left chest as  indicated in your monitor instructions.  Clean area with 4 enclosed alcohol  pads. Let dry.  Apply patch as indicated in monitor instructions. Patch will be placed under collarbone on left  side of chest with arrow pointing upward.  Rub patch adhesive wings for 2 minutes. Remove white label marked "1". Remove the white  label marked "2". Rub patch adhesive wings for 2 additional minutes.  While  looking in a mirror, press and release button in center of patch. A small green light will  flash 3-4 times. This will be your only indicator that the monitor has been turned on.  Do not shower for the first 24 hours. You may shower after the first 24 hours.  Press the button if you feel a symptom. You will hear a small click. Record Date, Time and  Symptom in the Patient Logbook.  When you are ready to remove the patch, follow instructions on the last 2 pages of Patient  Logbook. Stick patch monitor onto the last page of Patient Logbook.  Place Patient Logbook in the blue and white box. Use locking tab on box and tape box closed  securely. The blue and white box has prepaid postage on it. Please place it in the mailbox as  soon as possible. Your physician should have your test results approximately 7 days after the  monitor has been mailed back to [facility].  Call [facility] Customer Care at [phone] if you have questions regarding  your ZIO XT patch monitor. Call them immediately if you see an orange light blinking on your  monitor.  If your monitor falls off in less than 4 days, contact our Monitor department at [phone].  If your monitor becomes loose or falls off after 4 days call [employer] at [phone] for  suggestions on securing your monitor   Follow-Up: At [facility], you and your health needs are our priority.  As part of our continuing mission to provide you with exceptional heart care, our providers are all part of one team.  This team includes your primary Cardiologist (physician) and Advanced Practice Providers or APPs (Physician Assistants and Nurse Practitioners) who all work together to provide you with the care you need, when you need it.  Your next appointment:   12 month(s)  Provider:   Verne Carrow, MD  or Tereso Newcomer, PA-C         We recommend signing up for the patient portal called "MyChart".  Sign up information is provided  on this After Visit Summary.  MyChart is used to connect with patients for Virtual Visits (Telemedicine).  Patients are able to view lab/test results, encounter notes, upcoming appointments, etc.  Non-urgent messages can be sent to your provider as well.   To learn more about what you can do with MyChart, go to [email or web address].   Other Instructions       1st Floor: - Lobby - Registration  - Pharmacy  - Lab - Cafe  2nd Floor: - PV Lab - Diagnostic Testing (echo, CT, nuclear med)  3rd Floor: - Vacant  4th Floor: - TCTS (cardiothoracic surgery) - AFib Clinic - Structural Heart Clinic - Vascular Surgery  - Vascular Ultrasound  5th Floor: - HeartCare Cardiology (general and EP) - Clinical Pharmacy for coumadin, hypertension, lipid, weight-loss medications, and med management appointments    Valet parking services will be available as well.

## 2024-01-15 NOTE — Progress Notes (Signed)
 Cardiology Office Note:    Date:  01/15/2024  ID:  Jacob Le, DOB [birth year], MRN [id number] PCP: Margaree Mackintosh, MD  [facility] Providers Cardiologist:  Verne Carrow, MD       Patient Profile:      Coronary artery disease Mod non-obs disease by cath in 12/2017 LHC 12/18/2017: Proximal LAD 40, mid LAD 40; ostial LCx 30, proximal LCx 20; proximal RCA 40, mid RCA 20 Hx of pericarditis (admx 12/2017) Mild to mod MR  TTE 12/18/17: EF 60-65, normal wall motion, grade 2 diastolic dysfunction, mild AI, mild to moderate MR, moderate LAE, mild RAE, PASP 34  2nd degree AV block Type 1 Hyperlipidemia  OSA  Hx pancreatitis        Discussed the use of AI scribe software for clinical note transcription with the patient, who gave verbal consent to proceed.  History of Present Illness Jacob Le is a 73 y.o. male who turns for follow-up of CAD and Mobitz 1.  He was last seen in October 2023.  He is here alone.  His heart rate is slower today but he remains asymptomatic. He is very active, engaging in activities like pickleball and weight training without experiencing exercise intolerance. No chest symptoms, pressure, shortness of breath, dizziness, difficulty breathing when lying flat or syncope. He is unable to tolerate aspirin due to ecchymosis and has discontinued its use. He takes Crestor 5 mg daily for hyperlipidemia, which he tolerates well at this dose. He previously experienced muscle aches on a higher dose of Crestor.  His blood pressure has increased slightly over the years but remains within acceptable limits, typically around 130 mmHg systolic. He reports a history of lower extremity edema that began last year, for which he was prescribed triamterene.  He takes this as needed when he notices swelling in his extremities. He does not smoke.   Review of Systems  Hematologic/Lymphatic: Bruises/bleeds easily.  Gastrointestinal:  Negative for hematochezia and melena.   Genitourinary:  Negative for hematuria.  -See HPI    Studies Reviewed:   EKG Interpretation Date/Time:  Tuesday January 15 2024 15:52:22 EDT Ventricular Rate:  43 PR Interval:  180 QRS Duration:  84 QT Interval:  478 QTC Calculation: 403 R Axis:   27  Text Interpretation: Marked sinus bradycardia Mobitz I 2-degree AV block (Wenckebach block) Confirmed by Tereso Newcomer [phone]) on 01/15/2024 4:08:06 PM    Results Labs 08/31/2023: K 5.2, creatinine 1.33, ALT 30, total cholesterol 141, HDL 50, LDL 74, triglycerides 88, Hgb 15.4   Risk Assessment/Calculations:             Physical Exam:   VS:  BP (!) 118/58   Ht 5\' 8"  (1.727 m)   Wt 208 lb 12.8 oz (94.7 kg)   SpO2 95%   BMI 31.75 kg/m    Wt Readings from Last 3 Encounters:  01/15/24 208 lb 12.8 oz (94.7 kg)  09/03/23 208 lb (94.3 kg)  04/05/23 206 lb 4 oz (93.6 kg)    Constitutional:      Appearance: Healthy appearance. Not in distress.  Neck:     Vascular: No carotid bruit. JVD normal.  Pulmonary:     Breath sounds: Normal breath sounds. No wheezing. No rales.  Cardiovascular:     Normal rate. Regular rhythm.     Murmurs: There is a grade 1/6 systolic murmur at the LLSB.  Edema:    Peripheral edema absent.  Abdominal:     Palpations: Abdomen is soft.  Assessment and Plan:   Assessment & Plan Second degree AV block, Mobitz type I (Wenckebach) He is asymptomatic. HR today is in the 40s. I reviewed several EKGs. He has not had HRs this low in the past. He remain active and exercises regularly. He has not had symptoms of exercise intolerance. However, with a HR in the 40s and no AV nodal blocking agents on board, I think he should wear a cardiac monitor to rule out higher grade heart block.  - Obtain 7-day Zio XT monitor   Coronary artery disease Mild to moderate non-obstructive CAD by cardiac catheterization in 2019. He exercises regularly without angina.  He could not tolerate aspirin secondary to ecchymosis.   He is okay for him to remain off of this.   - Continue Crestor 5 mg daily.  Mitral regurgitation and aortic insufficiency He has mild to moderate mitral regurgitation and mild aortic insufficiency on echocardiogram in 2019. No symptoms suggest significant worsening.  However it has been 6 years since his last study. - Order a repeat echocardiogram to assess mitral valve and aortic valve status.  Hyperlipidemia His LDL cholesterol is 74 mg/dL, close to the goal. He is intolerant to higher statin doses due to muscular aches and tolerates Crestor 5 mg daily well.  - Continue Crestor 5 mg daily.       Dispo:  Return in about 1 year (around 01/14/2025) for Routine Follow Up, w/ Dr. Clifton James, or Tereso Newcomer, PA-C.  Signed, Tereso Newcomer, PA-C

## 2024-02-19 ENCOUNTER — Ambulatory Visit (HOSPITAL_COMMUNITY): Attending: Cardiology

## 2024-02-19 ENCOUNTER — Encounter: Payer: Self-pay | Admitting: Physician Assistant

## 2024-02-19 ENCOUNTER — Telehealth: Payer: Self-pay | Admitting: Physician Assistant

## 2024-02-19 DIAGNOSIS — I441 Atrioventricular block, second degree: Secondary | ICD-10-CM

## 2024-02-19 DIAGNOSIS — I2510 Atherosclerotic heart disease of native coronary artery without angina pectoris: Secondary | ICD-10-CM | POA: Insufficient documentation

## 2024-02-19 DIAGNOSIS — R001 Bradycardia, unspecified: Secondary | ICD-10-CM | POA: Insufficient documentation

## 2024-02-19 DIAGNOSIS — I340 Nonrheumatic mitral (valve) insufficiency: Secondary | ICD-10-CM | POA: Diagnosis not present

## 2024-02-19 DIAGNOSIS — I442 Atrioventricular block, complete: Secondary | ICD-10-CM

## 2024-02-19 HISTORY — DX: Atrioventricular block, complete: I44.2

## 2024-02-19 LAB — ECHOCARDIOGRAM COMPLETE
AR max vel: 2.28 cm2
AV Area VTI: 2.08 cm2
AV Area mean vel: 2.5 cm2
AV Mean grad: 10 mmHg
AV Peak grad: 20.6 mmHg
Ao pk vel: 2.27 m/s
Area-P 1/2: 4.21 cm2
MV M vel: 5.02 m/s
MV Peak grad: 100.8 mmHg
P 1/2 time: 680 ms
Radius: 0.9 cm
S' Lateral: 2.6 cm

## 2024-02-19 NOTE — Telephone Encounter (Signed)
 Just got results back and requested EP referral. Please ask for ASAP appt. See result note. Marlyse Single, PA-C    02/19/2024 5:17 PM

## 2024-02-19 NOTE — Telephone Encounter (Signed)
 Zio patch results abnormal.  One episode of complete heart block April 21 12:29 am.  Rate 34 bpm lasted 10.9 sec. Page 11, strip 5.

## 2024-02-19 NOTE — Telephone Encounter (Signed)
-----   Message from Antoinette Batman sent at 02/19/2024  4:24 PM EDT ----- Monitor with complete heart block. STAT EP referral. Please check with the patient and see how he is feeling. Larinda Plover

## 2024-02-19 NOTE — Telephone Encounter (Signed)
 Jacob Le with [employer] calling to report critical monitor result

## 2024-02-19 NOTE — Telephone Encounter (Signed)
 Left message for patient to call back

## 2024-02-20 ENCOUNTER — Other Ambulatory Visit: Payer: Self-pay | Admitting: *Deleted

## 2024-02-20 ENCOUNTER — Encounter: Payer: Self-pay | Admitting: Physician Assistant

## 2024-02-20 DIAGNOSIS — I38 Endocarditis, valve unspecified: Secondary | ICD-10-CM | POA: Insufficient documentation

## 2024-02-20 DIAGNOSIS — I340 Nonrheumatic mitral (valve) insufficiency: Secondary | ICD-10-CM

## 2024-02-20 DIAGNOSIS — I351 Nonrheumatic aortic (valve) insufficiency: Secondary | ICD-10-CM

## 2024-02-20 DIAGNOSIS — I350 Nonrheumatic aortic (valve) stenosis: Secondary | ICD-10-CM

## 2024-02-20 HISTORY — DX: Endocarditis, valve unspecified: I38

## 2024-02-20 NOTE — Progress Notes (Unsigned)
  Electrophysiology Office Note:   Date:  02/21/2024  ID:  Jacob Le, DOB 01-Mar-1951, MRN [id number]  Primary Cardiologist: Antoinette Batman, MD Primary Heart Failure: None Electrophysiologist: Ka Flammer Cortland Ding, MD      History of Present Illness:   Jacob Le is a 73 y.o. male with h/o nonobstructive coronary artery disease, mitral regurgitation, OSA, hyperlipidemia, secondary AV block seen today for  for Electrophysiology evaluation of heart block at the request of Marlyse Single.    He presented to cardiology clinic with heart rates that were slower than usual.  He was minimally symptomatic.  He is quite active, playing pickle ball and doing weight training.  He does not have shortness of breath, chest pain, syncope.  Today, denies symptoms of palpitations, chest pain, shortness of breath, orthopnea, PND, lower extremity edema, claudication, dizziness, presyncope, syncope, bleeding, or neurologic sequela. The patient is tolerating medications without difficulties.  When he is exercising, he has no restrictions.  He plays pickle ball for multiple hours, and works as a guard for multiple hours.  He is able to do all of his daily activities.  He has no acute complaints.  He does not get short of breath with exertion.  Review of systems complete and found to be negative unless listed in HPI.   EP Information / Studies Reviewed:    EKG is ordered today. Personal review as below.  EKG Interpretation Date/Time:  Thursday Feb 21 2024 11:38:34 EDT Ventricular Rate:  41 PR Interval:    QRS Duration:  78 QT Interval:  450 QTC Calculation: 371 R Axis:   35  Text Interpretation: Sinus rhythm Mobitz I 2-degree AV block (Wenckebach block) When compared with ECG of 21-Feb-2024 11:38, No significant change since last tracing Confirmed by Donta Mcinroy ([id number]) on 02/21/2024 11:55:17 AM     Risk Assessment/Calculations:             Physical Exam:   VS:  BP 130/84 (BP Location: Left  Arm, Patient Position: Sitting, Cuff Size: Large)   Pulse (!) 42   Ht 5\' 8"  (1.727 m)   Wt 208 lb (94.3 kg)   SpO2 97%   BMI 31.63 kg/m    Wt Readings from Last 3 Encounters:  02/21/24 208 lb (94.3 kg)  01/15/24 208 lb 12.8 oz (94.7 kg)  09/03/23 208 lb (94.3 kg)     GEN: Well nourished, well developed in no acute distress NECK: No JVD; No carotid bruits CARDIAC: Regular rate and rhythm, no murmurs, rubs, gallops RESPIRATORY:  Clear to auscultation without rales, wheezing or rhonchi  ABDOMEN: Soft, non-tender, non-distended EXTREMITIES:  No edema; No deformity   ASSESSMENT AND PLAN:    1.  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  He did have some episodes of complete heart block.  He is asymptomatic from this.  He is able to exercise without issues.  Jacob Le plan for exercise treadmill test to ensure that he has one-to-one conduction when he exercises.  As he is minimally symptomatic, and has a narrow QRS complex, we Jacob Le hold off on pacemaker implant unless there is an issue on his treadmill test.  2.  Coronary artery disease: Mild to moderate nonobstructive disease.  No current chest pain.  3.  Mild mitral and aortic insufficiency: Stable on most recent echo.  4.  Hyperlipidemia: Continue Crestor  per primary cardiology  Follow up with Dr. Lawana Pray pending exercise treadmill test   Signed, Jacob Etzler Cortland Ding, MD

## 2024-02-20 NOTE — Telephone Encounter (Signed)
 Called and spoke to pt; he states he would like clarification with any activity restrictions/recommendations until he sees Dr. Daneil Dunker on 02/26/24. He is very active and is concerned about his "complete heart block and urgency of the situation". He is completely asymptomatic and feels fine.

## 2024-02-20 NOTE — Telephone Encounter (Signed)
 Returned call to pt with Scott's recommendations below.  He was very appreciative of the call back.

## 2024-02-20 NOTE — Telephone Encounter (Signed)
  Called pt to offer waitlist tomorrow with Dr. Lawana Pray. Pt accepted the appt, he will see Dr. Lawana Pray at 11:30 AM 02/21/24

## 2024-02-20 NOTE — Telephone Encounter (Signed)
 He can continue his routine activities. I would avoid very strenuous activity (excessive straining, heavy lifting, vigorous exercise, etc) until he sees Dr. Lawana Pray tomorrow.  Marlyse Single, PA-C    02/20/2024 1:42 PM

## 2024-02-20 NOTE — Telephone Encounter (Signed)
  Pt made an appt with Dr. Daneil Dunker on 05/13 at 2:00 PM. He wants to know what he need to do while waiting for his appt

## 2024-02-21 ENCOUNTER — Encounter: Payer: Self-pay | Admitting: Cardiology

## 2024-02-21 ENCOUNTER — Ambulatory Visit: Attending: Cardiology | Admitting: Cardiology

## 2024-02-21 VITALS — BP 130/84 | HR 42 | Ht 68.0 in | Wt 208.0 lb

## 2024-02-21 DIAGNOSIS — R9431 Abnormal electrocardiogram [ECG] [EKG]: Secondary | ICD-10-CM | POA: Diagnosis not present

## 2024-02-21 DIAGNOSIS — R001 Bradycardia, unspecified: Secondary | ICD-10-CM

## 2024-02-21 DIAGNOSIS — I441 Atrioventricular block, second degree: Secondary | ICD-10-CM

## 2024-02-21 NOTE — Addendum Note (Signed)
 Addended by: Alvenia Aus on: 02/21/2024 12:16 PM   Modules accepted: Orders

## 2024-02-21 NOTE — Patient Instructions (Addendum)
 Medication Instructions:  Your physician recommends that you continue on your current medications as directed. Please refer to the Current Medication list given to you today.  *If you need a refill on your cardiac medications before your next appointment, please call your pharmacy*  Lab Work: None ordered  If you have any lab test that is abnormal or we need to change your treatment, we will call you to review the results.  Testing/Procedures: Your physician has requested that you have an exercise tolerance test. For further information please visit [email or web address]        DO NOT drink or eat foods with caffeine for 24 hours before the test. (Chocolate, coffee, tea, decaf coffee/tea, or energy drinks) DO NOT smoke for 4 hours before your test. If you use an inhaler, bring it with you to the test. Wear comfortable shoes and clothing. Women do not wear dresses.   Follow-Up: At [facility], you and your health needs are our priority.  As part of our continuing mission to provide you with exceptional heart care, our providers are all part of one team.  This team includes your primary Cardiologist (physician) and Advanced Practice Providers or APPs (Physician Assistants and Nurse Practitioners) who all work together to provide you with the care you need, when you need it.  Your next appointment:   To be determined after treadmill testing  Provider:   Agatha Horsfall, MD     Thank you for choosing [facility]!!   Reece Cane, RN [phone]   Other Instructions

## 2024-02-26 ENCOUNTER — Institutional Professional Consult (permissible substitution): Admitting: Cardiology

## 2024-03-03 ENCOUNTER — Institutional Professional Consult (permissible substitution): Admitting: Cardiovascular Disease

## 2024-03-03 ENCOUNTER — Other Ambulatory Visit: Payer: Medicare HMO

## 2024-03-03 DIAGNOSIS — E7439 Other disorders of intestinal carbohydrate absorption: Secondary | ICD-10-CM

## 2024-03-03 DIAGNOSIS — E7800 Pure hypercholesterolemia, unspecified: Secondary | ICD-10-CM

## 2024-03-03 DIAGNOSIS — N1831 Chronic kidney disease, stage 3a: Secondary | ICD-10-CM

## 2024-03-03 NOTE — Progress Notes (Signed)
 Patient Care Team: Sylvan Evener, MD as PCP - General (Internal Medicine) Jacob Benne, MD as PCP - Cardiology (Cardiology) Jacob Pump, MD as PCP - Electrophysiology (Cardiology)  Visit Date: 03/04/24  Subjective:   Chief Complaint  Patient presents with   Medical Management of Chronic Issues  Patient ZO:XWRUEA Cerino,Male DOB:05/09/1951,73 y.o. VWU:[id number]   73 y.o.Male presents today for 6 months follow-up for Hyperlipidemia; Impaired Glucose Tolerance; Chronic Kidney Disease. Patient has a past medical history of Dependent Edema. Seen for his annual on 09/03/23, in the interim has seen Sports Med, Optometry, and Cardiology x2.   Has been followed by Cardiology for Mitral Regurgitation/Aortic Insuffiencey on echo in 2019; CAD and Mobitz 1 AV Block, recently seen by them for bradycardia, had an echo and Holter monitor done, with an Exercise Tolerance Test ordered for 5/28.   History of Hyperlipidemia treated with  Rosuvastatin  5 mg daily. 03/03/2024 Lipid Panel: WNL.   History of Impaired Glucose Tolerance with 03/03/2024 HgbA1c 6.0, decreased from 6.1 in 08/2023. Discussed   History of Dependent Edema treated with Maxzide-25  37.5 - 25 mg as needed, once every couple of weeks in regards to his CKD. Blood Pressure: normotensive today at 110/60.   History of Chronic Kidney Disease, stage 3a w/ 03/03/2024 Hepatic Function Panel: Direct Bilirubin 0.3; Total Bilirubin 1.5; otherwise WNL.   Vaccine Counseling: Covid-19 - postponed; Shingrix - discussed, he will do this on his own time. Past Medical History:  Diagnosis Date   Chronic kidney disease    stage 3   Complete heart block (HCC) 02/19/2024   Monitor 01/2024: Minimum heart rate 26, average heart rate 47; first-degree AV block; second-degree AV block type I; 6 episodes of third-degree AV block  lasting 1 minute 13 seconds; PVCs <1%    Dysrhythmia    2nd degree heart block, Type 1   H/O nasal septoplasty  1996   Hyperlipidemia    on meds   PONV (postoperative nausea and vomiting)    Sleep apnea    without CPAP   Squamous cell cancer of external ear, left 2018   Valvular heart disease 02/20/2024   TTE 02/19/2024: EF 60-65, no RWMA, GR 2 DD, normal RVSF, mild RVE, normal PASP (RVSP 24.3, moderate LAE, mild-moderate MR, mild-moderate AI, mild aortic stenosis (mean gradient 10, V-max 227 cm/s, DI 0.66)     Allergies  Allergen Reactions   Codeine Nausea And Vomiting   Other Nausea And Vomiting    Any narcotic    Family History  Problem Relation Age of Onset   Cancer Mother    Colon cancer Neg Hx    Colon polyps Neg Hx    Esophageal cancer Neg Hx    Prostate cancer Neg Hx    Rectal cancer Neg Hx    Stomach cancer Neg Hx    Social History   Social History Narrative   Not on file  Fairly active - enjoys playing pickleball Review of Systems  Constitutional:  Negative for fever and malaise/fatigue.  HENT:  Negative for congestion.   Eyes:  Negative for blurred vision.  Respiratory:  Negative for cough and shortness of breath.   Cardiovascular:  Positive for leg swelling (occasional). Negative for chest pain and palpitations.  Gastrointestinal:  Negative for vomiting.  Musculoskeletal:  Negative for back pain.  Skin:  Negative for rash.  Neurological:  Negative for loss of consciousness and headaches.     Objective:  Vitals: BP 110/60   Pulse Jacob Le)  48   Ht 5\' 8"  (1.727 m)   Wt 209 lb (94.8 kg)   SpO2 98%   BMI 31.78 kg/m   Physical Exam Constitutional:      General: He is not in acute distress.    Appearance: Normal appearance. He is not ill-appearing.  HENT:     Head: Normocephalic and atraumatic.  Cardiovascular:     Rate and Rhythm: Regular rhythm. Bradycardia present.     Pulses: Normal pulses.     Heart sounds: Normal heart sounds. No murmur heard.    No friction rub. No gallop.  Pulmonary:     Effort: Pulmonary effort is normal. No respiratory distress.      Breath sounds: Normal breath sounds. No wheezing or rales.  Musculoskeletal:     Right lower leg: No edema (today).     Left lower leg: No edema (today).  Skin:    General: Skin is warm and dry.  Neurological:     Mental Status: He is alert and oriented to person, place, and time. Mental status is at baseline.  Psychiatric:        Mood and Affect: Mood normal.        Behavior: Behavior normal.        Thought Content: Thought content normal.        Judgment: Judgment normal.     Results:  Studies Obtained And Personally Reviewed By Me:  11.5 Day Holter Monitor 4/14 - 02/09/2024 Min HR of 26 bpm  Max HR of 108 bpm  Avg HR of 47 bpm  Predominant underlying rhythm was Sinus Rhythm  First Degree AV Block was present  6 episode( s) of AV Block ( 3rd ) occurred, lasting a total of 1 min 13 secs  Second Degree AV Block- Mobitz I ( Wenckebach) was present  No Isolated SVEs, SVE Couplets, or SVE Triplets were present   Isolated VEs were rare ( < 1. 0% , 2066)  VE Couplets were rare ( < 1. 0% , 9)   VE Triplets were rare ( < 1. 0% , 1)  Ventricular Bigeminy was present  MD notification criteria for Complete Heart Block met - report posted prior to notification ( [place])  Echocardiogram 02/19/2024  Left ventricular ejection fraction, by estimation, is 60 to 65% . The left ventricle has normal function. The left ventricle has no regional wall motion abnormalities. Left ventricular diastolic parameters are consistent with Grade II diastolic dysfunction ( pseudonormalization)  Right ventricular systolic function is normal. The right ventricular size is mildly enlarged. There is normal pulmonary artery systolic pressure. The estimated right ventricular systolic pressure is 24. 3 mmHg.   Left atrial size was moderately dilated.   The mitral valve is normal in structure. Mild to moderate mitral valve regurgitation.   The aortic valve is tricuspid. There is mild calcification of the  aortic valve. Aortic valve regurgitation is mild to moderate. Mild aortic valve stenosis. Aortic valve mean gradient measures 10. 0 mmHg. Aortic valve Vmax measures 2. 27 m/ s.   6. The inferior vena cava is normal in size with greater than 50% respiratory variability, suggesting right atrial pressure of 3 mmHg.  Labs:     Component Value Date/Time   NA 140 08/31/2023 0913   K 5.2 08/31/2023 0913   CL 105 08/31/2023 0913   CO2 27 08/31/2023 0913   GLUCOSE 114 (H) 08/31/2023 0913   BUN 27 (H) 08/31/2023 0913   CREATININE 1.33 (H) 08/31/2023 0913  CALCIUM  10.1 08/31/2023 0913   PROT 6.6 03/03/2024 0908   PROT 7.4 04/10/2018 1217   ALBUMIN 4.9 05/03/2022 0956   ALBUMIN 4.9 (H) 04/10/2018 1217   AST 23 03/03/2024 0908   ALT 28 03/03/2024 0908   ALKPHOS 67 05/03/2022 0956   BILITOT 1.5 (H) 03/03/2024 0908   BILITOT 1.9 (H) 04/10/2018 1217   GFRNONAA >60 02/27/2022 0943   GFRNONAA 51 (L) 05/25/2020 1155   GFRAA 59 (L) 05/25/2020 1155    Lab Results  Component Value Date   WBC 5.4 08/31/2023   HGB 15.4 08/31/2023   HCT 45.4 08/31/2023   MCV 91.0 08/31/2023   PLT 280 08/31/2023   Lab Results  Component Value Date   CHOL 120 03/03/2024   HDL 47 03/03/2024   LDLCALC 58 03/03/2024   TRIG 69 03/03/2024   CHOLHDL 2.6 03/03/2024   Lab Results  Component Value Date   HGBA1C 6.0 (H) 03/03/2024    Lab Results  Component Value Date   TSH 1.950 12/18/2017    Lab Results  Component Value Date   PSA 1.99 08/31/2023   PSA 1.42 07/28/2022   PSA 1.00 08/01/2021     Assessment & Plan:   Meds ordered this encounter  Medications   rosuvastatin  (CRESTOR ) 5 MG tablet    Sig: Take 1 tablet (5 mg total) by mouth daily.    Dispense:  90 tablet    Refill:  1   metFORMIN (GLUCOPHAGE) 500 MG tablet    Sig: Take 1 tablet (500 mg total) by mouth 2 (two) times daily with a meal.    Dispense:  180 tablet    Refill:  0   Hyperlipidemia treated with  Rosuvastatin  5 mg daily. 03/03/2024  Lipid Panel: WNL. Refilled Rosuvastatin  today.  Impaired Glucose Tolerance with 03/03/2024 HgbA1c 6.0, decreased from 6.1 in 08/2023. Discussed Metformin - he is agreeable, will send in to take 500 mg twice daily.  Dependent Edema treated with Maxzide-25  37.5 - 25 mg as needed, once every couple of weeks in regards to his CKD. Blood Pressure: normotensive today at 110/60.   Chronic Kidney Disease, stage 3a- creatinine was 1.33 in November 2024. Continue to monitor.   Has been followed by Cardiology for Mitral Regurgitation/Aortic Insuffiencey on echo in 2019; CAD and Mobitz 1 AV Block, recently seen by them for bradycardia, had an echo and Holter monitor done, with an Exercise Tolerance Test ordered for 5/28.   Vaccine Counseling: Covid-19 - postponed; Shingrix - discussed, he will  receive these vaccines at pharmacy.    I,Emily Lagle,acting as a Neurosurgeon for Sylvan Evener, MD.,have documented all relevant documentation on the behalf of Sylvan Evener, MD,as directed by  Sylvan Evener, MD while in the presence of Sylvan Evener, MD.   I, Sylvan Evener, MD, have reviewed all documentation for this visit. The documentation on 03/10/24 for the exam, diagnosis, procedures, and orders are all accurate and complete.

## 2024-03-04 ENCOUNTER — Ambulatory Visit (INDEPENDENT_AMBULATORY_CARE_PROVIDER_SITE_OTHER): Payer: Medicare HMO | Admitting: Internal Medicine

## 2024-03-04 VITALS — BP 110/60 | HR 48 | Ht 68.0 in | Wt 209.0 lb

## 2024-03-04 DIAGNOSIS — I351 Nonrheumatic aortic (valve) insufficiency: Secondary | ICD-10-CM

## 2024-03-04 DIAGNOSIS — E7439 Other disorders of intestinal carbohydrate absorption: Secondary | ICD-10-CM | POA: Diagnosis not present

## 2024-03-04 DIAGNOSIS — E7800 Pure hypercholesterolemia, unspecified: Secondary | ICD-10-CM

## 2024-03-04 DIAGNOSIS — R609 Edema, unspecified: Secondary | ICD-10-CM

## 2024-03-04 DIAGNOSIS — Z8679 Personal history of other diseases of the circulatory system: Secondary | ICD-10-CM | POA: Diagnosis not present

## 2024-03-04 DIAGNOSIS — I340 Nonrheumatic mitral (valve) insufficiency: Secondary | ICD-10-CM

## 2024-03-04 DIAGNOSIS — Z8719 Personal history of other diseases of the digestive system: Secondary | ICD-10-CM | POA: Diagnosis not present

## 2024-03-04 DIAGNOSIS — N1831 Chronic kidney disease, stage 3a: Secondary | ICD-10-CM | POA: Diagnosis not present

## 2024-03-04 DIAGNOSIS — I441 Atrioventricular block, second degree: Secondary | ICD-10-CM | POA: Diagnosis not present

## 2024-03-04 LAB — LIPID PANEL
Cholesterol: 120 mg/dL (ref <200)
HDL: 47 mg/dL (ref >=40)
LDL Cholesterol (Calc): 58 mg/dL
Non-HDL Cholesterol (Calc): 73 mg/dL (ref <130)
Total CHOL/HDL Ratio: 2.6 (calc) (ref <5.0)
Triglycerides: 69 mg/dL (ref <150)

## 2024-03-04 LAB — HEPATIC FUNCTION PANEL
AG Ratio: 2.5 (calc) (ref 1.0–2.5)
ALT: 28 U/L (ref 9–46)
AST: 23 U/L (ref 10–35)
Albumin: 4.7 g/dL (ref 3.6–5.1)
Alkaline phosphatase (APISO): 59 U/L (ref 35–144)
Bilirubin, Direct: 0.3 mg/dL — ABNORMAL HIGH (ref 0.0–0.2)
Globulin: 1.9 g/dL (ref 1.9–3.7)
Indirect Bilirubin: 1.2 mg/dL (ref 0.2–1.2)
Total Bilirubin: 1.5 mg/dL — ABNORMAL HIGH (ref 0.2–1.2)
Total Protein: 6.6 g/dL (ref 6.1–8.1)

## 2024-03-04 LAB — HEMOGLOBIN A1C
Hgb A1c MFr Bld: 6 % — ABNORMAL HIGH (ref <5.7)
Mean Plasma Glucose: 126 mg/dL
eAG (mmol/L): 7 mmol/L

## 2024-03-04 MED ORDER — ROSUVASTATIN CALCIUM 5 MG PO TABS: 5.0000 mg | ORAL_TABLET | Freq: Every day | ORAL | 1 refills | Status: DC

## 2024-03-04 MED ORDER — METFORMIN HCL 500 MG PO TABS: 500.0000 mg | ORAL_TABLET | Freq: Two times a day (BID) | ORAL | 0 refills | Status: DC

## 2024-03-06 ENCOUNTER — Telehealth (HOSPITAL_COMMUNITY): Payer: Self-pay | Admitting: *Deleted

## 2024-03-06 NOTE — Telephone Encounter (Signed)
 Reminder call given for upcoming GXT on 03/12/24 at 3:30

## 2024-03-10 NOTE — Patient Instructions (Signed)
 Please continue close Cardiology follow up. Continue diet and exercise efforts. Return in 6 months for Medicare wellness and health maintenance exams.

## 2024-03-11 NOTE — Addendum Note (Signed)
 Addended by: Flonnie Humphrey on: 03/11/2024 02:45 PM   Modules accepted: Orders

## 2024-03-11 NOTE — Addendum Note (Signed)
 Addended by: Alvenia Aus on: 03/11/2024 09:43 AM   Modules accepted: Orders

## 2024-03-12 ENCOUNTER — Ambulatory Visit (HOSPITAL_COMMUNITY)
Admission: RE | Admit: 2024-03-12 | Discharge: 2024-03-12 | Disposition: A | Discharge status: Home / Self Care | Source: Ambulatory Visit | Attending: Cardiovascular Disease | Admitting: Cardiovascular Disease

## 2024-03-12 DIAGNOSIS — R001 Bradycardia, unspecified: Secondary | ICD-10-CM

## 2024-03-12 DIAGNOSIS — R9431 Abnormal electrocardiogram [ECG] [EKG]: Secondary | ICD-10-CM | POA: Diagnosis not present

## 2024-03-12 DIAGNOSIS — I441 Atrioventricular block, second degree: Secondary | ICD-10-CM | POA: Diagnosis not present

## 2024-03-12 LAB — EXERCISE TOLERANCE TEST
Angina Index: 0
Duke Treadmill Score: 3
Estimated workload: 4.8
Exercise duration (min): 3 min
Exercise duration (sec): 10 s
MPHR: 146 {beats}/min
Peak HR: 111 {beats}/min
Percent HR: 76 %
Rest HR: 66 {beats}/min
ST Depression (mm): 0 mm

## 2024-03-16 ENCOUNTER — Ambulatory Visit: Payer: Self-pay | Admitting: Cardiology

## 2024-03-24 ENCOUNTER — Telehealth: Payer: Self-pay | Admitting: Cardiology

## 2024-03-24 NOTE — Telephone Encounter (Signed)
Pt returning call for stress test results  

## 2024-03-25 NOTE — Telephone Encounter (Signed)
Informed patient of results and verbal understanding expressed. See result note

## 2024-06-02 ENCOUNTER — Other Ambulatory Visit: Payer: Self-pay | Admitting: Internal Medicine

## 2024-08-12 ENCOUNTER — Ambulatory Visit
Admission: RE | Admit: 2024-08-12 | Discharge: 2024-08-12 | Disposition: A | Discharge status: Home / Self Care | Attending: Family Medicine | Admitting: Family Medicine

## 2024-08-12 VITALS — BP 162/78 | HR 59 | Temp 97.8°F | Resp 19

## 2024-08-12 DIAGNOSIS — J069 Acute upper respiratory infection, unspecified: Secondary | ICD-10-CM

## 2024-08-12 DIAGNOSIS — R059 Cough, unspecified: Secondary | ICD-10-CM

## 2024-08-12 MED ORDER — AMOXICILLIN-POT CLAVULANATE 875-125 MG PO TABS: 1.0000 | ORAL_TABLET | Freq: Two times a day (BID) | ORAL | 0 refills | Status: AC

## 2024-08-12 MED ORDER — PREDNISONE 20 MG PO TABS
ORAL_TABLET | ORAL | 0 refills | Status: AC
Start: 2024-08-12 — End: ?

## 2024-08-12 NOTE — ED Provider Notes (Signed)
 [facility]    CSN: [id number] Arrival date & time: 08/12/24  0916      History   Chief Complaint Chief Complaint  Patient presents with   Cough    Minor congestion but persistent cough for the past four weeks. - Entered by patient    HPI Jacob Le is a 73 y.o. male.   HPI 73 year old male presents with congestion, cough and fatigue for 5 weeks.  PMH significant for complete heart block, chronic kidney disease, and HLD.  Past Medical History:  Diagnosis Date   Chronic kidney disease    stage 3   Complete heart block (HCC) 02/19/2024   Monitor 01/2024: Minimum heart rate 26, average heart rate 47; first-degree AV block; second-degree AV block type I; 6 episodes of third-degree AV block  lasting 1 minute 13 seconds; PVCs <1%    Dysrhythmia    2nd degree heart block, Type 1   H/O nasal septoplasty 1996   Hyperlipidemia    on meds   PONV (postoperative nausea and vomiting)    Sleep apnea    without CPAP   Squamous cell cancer of external ear, left 2018   Valvular heart disease 02/20/2024   TTE 02/19/2024: EF 60-65, no RWMA, GR 2 DD, normal RVSF, mild RVE, normal PASP (RVSP 24.3, moderate LAE, mild-moderate MR, mild-moderate AI, mild aortic stenosis (mean gradient 10, V-max 227 cm/s, DI 0.66)     Patient Active Problem List   Diagnosis Date Noted   Valvular heart disease 02/20/2024   Complete heart block (HCC) 02/19/2024   CAD (coronary artery disease) 01/15/2024   History of pancreatitis 09/11/2021   History of pericarditis 09/11/2021   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   Spinal stenosis of lumbar region 04/30/2019   AV block, [place] 1 12/18/2017   Allergic rhinitis 11/12/2017   OSA on CPAP 01/04/2017    Past Surgical History:  Procedure Laterality Date   BIOPSY  06/08/2022   Procedure: BIOPSY;  Surgeon: Wilhelmenia Aloha Raddle., MD;  Location: [facility];  Service: Gastroenterology;;   broken left shoulder     no surgerical  intervention   CHOLECYSTECTOMY     COLONOSCOPY  2012   In [place]    ESOPHAGOGASTRODUODENOSCOPY (EGD) WITH PROPOFOL  N/A 06/08/2022   Procedure: ESOPHAGOGASTRODUODENOSCOPY (EGD) WITH PROPOFOL ;  Surgeon: Wilhelmenia Aloha Raddle., MD;  Location: [facility];  Service: Gastroenterology;  Laterality: N/A;   EUS  06/08/2022   Procedure: UPPER ENDOSCOPIC ULTRASOUND (EUS) LINEAR;  Surgeon: Wilhelmenia Aloha Raddle., MD;  Location: [facility];  Service: Gastroenterology;;   KNEE ARTHROSCOPY W/ ACL RECONSTRUCTION Left 1990   LEFT HEART CATH AND CORONARY ANGIOGRAPHY N/A 12/18/2017   Procedure: LEFT HEART CATH AND CORONARY ANGIOGRAPHY;  Surgeon: Verlin Lonni BIRCH, MD;  Location: [facility];  Service: Cardiovascular;  Laterality: N/A;   MENISCUS REPAIR Right 1992   pancreatis attack     pericarditis     ROTATOR CUFF REPAIR Right 2014       Home Medications    Prior to Admission medications   Medication Sig Start Date End Date Taking? Authorizing Provider  amoxicillin-clavulanate (AUGMENTIN) 875-125 MG tablet Take 1 tablet by mouth every 12 (twelve) hours. 08/12/24  Yes Teddy Sharper, FNP  predniSONE (DELTASONE) 20 MG tablet Take 3 tabs PO daily x 5 days. 08/12/24  Yes Teddy Sharper, FNP  Coenzyme Q10 100 MG capsule Take 100 mg by mouth daily.    [provider]  metFORMIN  (GLUCOPHAGE ) 500 MG tablet TAKE 1  TABLET BY MOUTH 2 TIMES DAILY WITH A MEAL. 06/02/24   Perri Ronal PARAS, MD  Multiple Vitamins-Minerals (CENTRUM SILVER 50+MEN) TABS Take 1 tablet by mouth daily.    [provider]  rosuvastatin  (CRESTOR ) 5 MG tablet Take 1 tablet (5 mg total) by mouth daily. 03/04/24   Perri Ronal PARAS, MD  sildenafil  (VIAGRA ) 100 MG tablet Take 0.5-1 tablets (50-100 mg total) by mouth daily as needed for erectile dysfunction. 09/03/23   Perri Ronal PARAS, MD  triamterene -hydrochlorothiazide (MAXZIDE-25) 37.5-25 MG tablet Take 1 tablet by mouth as needed (for swelling).    [provider]    Family History Family History  Problem Relation Age of Onset   Cancer Mother    Colon cancer Neg Hx    Colon polyps Neg Hx    Esophageal cancer Neg Hx    Prostate cancer Neg Hx    Rectal cancer Neg Hx    Stomach cancer Neg Hx     Social History Social History   Tobacco Use   Smoking status: Never   Smokeless tobacco: Never  Vaping Use   Vaping status: Never Used  Substance Use Topics   Alcohol use: Not Currently    Alcohol/week: 0.0 - 4.0 standard drinks of alcohol   Drug use: No     Allergies   Codeine and Other   Review of Systems Review of Systems  HENT:  Positive for congestion.   Respiratory:  Positive for cough.   All other systems reviewed and are negative.    Physical Exam Triage Vital Signs ED Triage Vitals [08/12/24 0936]  Encounter Vitals Group     BP      Girls Systolic BP Percentile      Girls Diastolic BP Percentile      Boys Systolic BP Percentile      Boys Diastolic BP Percentile      Pulse      Resp      Temp      Temp src      SpO2      Weight      Height      Head Circumference      Peak Flow      Pain Score 0     Pain Loc      Pain Education      Exclude from Growth Chart    No data found.  Updated Vital Signs BP (!) 162/78   Pulse (!) 59   Temp 97.8 F (36.6 C)   Resp 19   SpO2 98%     Physical Exam Vitals and nursing note reviewed.  Constitutional:      General: He is not in acute distress.    Appearance: Normal appearance. He is normal weight. He is ill-appearing.  HENT:     Head: Normocephalic and atraumatic.     Right Ear: Tympanic membrane and external ear normal.     Left Ear: Tympanic membrane and external ear normal.     Ears:     Comments: Significant eustachian tube dysfunction noted bilaterally    Mouth/Throat:     Mouth: Mucous membranes are moist.     Pharynx: Oropharynx is clear.  Eyes:     Extraocular Movements: Extraocular movements intact.     Pupils: Pupils are equal,  round, and reactive to light.  Cardiovascular:     Rate and Rhythm: Normal rate and regular rhythm.     Heart sounds: Normal heart sounds.  Pulmonary:  Effort: Pulmonary effort is normal.     Breath sounds: Normal breath sounds. No wheezing, rhonchi or rales.     Comments: Infrequent nonproductive cough on exam Musculoskeletal:        General: Normal range of motion.  Skin:    General: Skin is warm and dry.  Neurological:     General: No focal deficit present.     Mental Status: He is alert and oriented to person, place, and time. Mental status is at baseline.  Psychiatric:        Mood and Affect: Mood normal.        Behavior: Behavior normal.      UC Treatments / Results  Labs (all labs ordered are listed, but only abnormal results are displayed) Labs Reviewed - No data to display  EKG   Radiology No results found.  Procedures Procedures (including critical care time)  Medications Ordered in UC Medications - No data to display  Initial Impression / Assessment and Plan / UC Course  I have reviewed the triage vital signs and the nursing notes.  Pertinent labs & imaging results that were available during my care of the patient were reviewed by me and considered in my medical decision making (see chart for details).     MDM: 1.  Acute URI-Rx'd Augmentin 875/125 mg tablet: Take 1 tablet twice daily x 7 days; 2.  Cough, unspecified type-Rx'd prednisone 20 mg tablet: Take 3 tablets p.o. daily x 5 days. Advised patient to take medications as directed with food to completion.  Advised to take prednisone with first dose of Augmentin for the next 5 of 7 days.  Encouraged to increase daily water intake to 64 ounces per day while taking these medications.  Advised if symptoms worsen and/or unresolved please follow-up with your PCP or here for further evaluation.  Patient discharged home, hemodynamically stable. Final Clinical Impressions(s) / UC Diagnoses   Final diagnoses:   Cough, unspecified type  Acute URI     Discharge Instructions      Advised patient to take medications as directed with food to completion.  Advised to take prednisone with first dose of Augmentin for the next 5 of 7 days.  Encouraged to increase daily water intake to 64 ounces per day while taking these medications.  Advised if symptoms worsen and/or unresolved please follow-up with your PCP or here for further evaluation.     ED Prescriptions     Medication Sig Dispense Auth. Provider   amoxicillin-clavulanate (AUGMENTIN) 875-125 MG tablet Take 1 tablet by mouth every 12 (twelve) hours. 14 tablet Jesscia Imm, FNP   predniSONE (DELTASONE) 20 MG tablet Take 3 tabs PO daily x 5 days. 15 tablet Mariane Burpee, FNP      PDMP not reviewed this encounter.   Teddy Sharper, FNP 08/12/24 [phone]

## 2024-08-12 NOTE — ED Triage Notes (Signed)
 Pt presents to uc with co congestion, cough and fatigue  for 5 weeks. Pt has been taking otc medications with minimal improvement.

## 2024-08-12 NOTE — Discharge Instructions (Addendum)
 Advised patient to take medications as directed with food to completion.  Advised to take prednisone  with first dose of Augmentin  for the next 5 of 7 days.  Encouraged to increase daily water intake to 64 ounces per day while taking these medications.  Advised if symptoms worsen and/or unresolved please follow-up with your PCP or here for further evaluation.

## 2024-08-27 ENCOUNTER — Other Ambulatory Visit: Payer: Self-pay | Admitting: Internal Medicine

## 2024-08-28 ENCOUNTER — Other Ambulatory Visit: Payer: Self-pay | Admitting: Internal Medicine

## 2024-09-08 ENCOUNTER — Other Ambulatory Visit

## 2024-09-09 ENCOUNTER — Ambulatory Visit: Admitting: Internal Medicine
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
## 2024-09-26 ENCOUNTER — Other Ambulatory Visit: Payer: Self-pay

## 2024-09-26 DIAGNOSIS — E7439 Other disorders of intestinal carbohydrate absorption: Secondary | ICD-10-CM

## 2024-09-26 DIAGNOSIS — N1831 Chronic kidney disease, stage 3a: Secondary | ICD-10-CM

## 2024-09-26 DIAGNOSIS — Z0000 Encounter for general adult medical examination without abnormal findings: Secondary | ICD-10-CM

## 2024-09-26 DIAGNOSIS — E1169 Type 2 diabetes mellitus with other specified complication: Secondary | ICD-10-CM

## 2024-09-26 DIAGNOSIS — E7800 Pure hypercholesterolemia, unspecified: Secondary | ICD-10-CM

## 2024-09-27 LAB — HEMOGLOBIN A1C
Hgb A1c MFr Bld: 5.9 % — ABNORMAL HIGH (ref <5.7)
Mean Plasma Glucose: 123 mg/dL
eAG (mmol/L): 6.8 mmol/L

## 2024-09-27 LAB — CBC WITH DIFFERENTIAL/PLATELET
Absolute Lymphocytes: 2542 {cells}/uL (ref 850–3900)
Absolute Monocytes: 533 {cells}/uL (ref 200–950)
Basophils Absolute: 50 {cells}/uL (ref 0–200)
Basophils Relative: 0.8 %
Eosinophils Absolute: 366 {cells}/uL (ref 15–500)
Eosinophils Relative: 5.9 %
HCT: 43 % (ref 39.4–51.1)
Hemoglobin: 14.4 g/dL (ref 13.2–17.1)
MCH: 30.1 pg (ref 27.0–33.0)
MCHC: 33.5 g/dL (ref 31.6–35.4)
MCV: 90 fL (ref 81.4–101.7)
MPV: 10.6 fL (ref 7.5–12.5)
Monocytes Relative: 8.6 %
Neutro Abs: 2709 {cells}/uL (ref 1500–7800)
Neutrophils Relative %: 43.7 %
Platelets: 259 Thousand/uL (ref 140–400)
RBC: 4.78 Million/uL (ref 4.20–5.80)
RDW: 13.1 % (ref 11.0–15.0)
Total Lymphocyte: 41 %
WBC: 6.2 Thousand/uL (ref 3.8–10.8)

## 2024-09-27 LAB — COMPREHENSIVE METABOLIC PANEL WITH GFR
AG Ratio: 2.4 (calc) (ref 1.0–2.5)
ALT: 23 U/L (ref 9–46)
AST: 23 U/L (ref 10–35)
Albumin: 4.7 g/dL (ref 3.6–5.1)
Alkaline phosphatase (APISO): 70 U/L (ref 35–144)
BUN: 19 mg/dL (ref 7–25)
CO2: 28 mmol/L (ref 20–32)
Calcium: 9.3 mg/dL (ref 8.6–10.3)
Chloride: 106 mmol/L (ref 98–110)
Creat: 1.15 mg/dL (ref 0.70–1.28)
Globulin: 2 g/dL (ref 1.9–3.7)
Glucose, Bld: 88 mg/dL (ref 65–99)
Potassium: 5 mmol/L (ref 3.5–5.3)
Sodium: 140 mmol/L (ref 135–146)
Total Bilirubin: 1.6 mg/dL — ABNORMAL HIGH (ref 0.2–1.2)
Total Protein: 6.7 g/dL (ref 6.1–8.1)
eGFR: 67 mL/min/1.73m2 (ref >=60)

## 2024-09-27 LAB — LIPID PANEL
Cholesterol: 130 mg/dL (ref <200)
HDL: 56 mg/dL (ref >=40)
LDL Cholesterol (Calc): 58 mg/dL
Non-HDL Cholesterol (Calc): 74 mg/dL (ref <130)
Total CHOL/HDL Ratio: 2.3 (calc) (ref <5.0)
Triglycerides: 82 mg/dL (ref <150)

## 2024-09-27 LAB — PSA: PSA: 2.07 ng/mL (ref <=4.00)

## 2024-09-29 ENCOUNTER — Encounter: Payer: Self-pay | Admitting: Internal Medicine

## 2024-09-29 ENCOUNTER — Ambulatory Visit: Payer: Self-pay | Admitting: Internal Medicine

## 2024-09-29 VITALS — BP 160/80 | HR 54 | Ht 66.5 in | Wt 182.0 lb

## 2024-09-29 DIAGNOSIS — I340 Nonrheumatic mitral (valve) insufficiency: Secondary | ICD-10-CM

## 2024-09-29 DIAGNOSIS — N1831 Chronic kidney disease, stage 3a: Secondary | ICD-10-CM

## 2024-09-29 DIAGNOSIS — K219 Gastro-esophageal reflux disease without esophagitis: Secondary | ICD-10-CM

## 2024-09-29 DIAGNOSIS — E7439 Other disorders of intestinal carbohydrate absorption: Secondary | ICD-10-CM

## 2024-09-29 DIAGNOSIS — Z23 Encounter for immunization: Secondary | ICD-10-CM

## 2024-09-29 DIAGNOSIS — E785 Hyperlipidemia, unspecified: Secondary | ICD-10-CM | POA: Diagnosis not present

## 2024-09-29 DIAGNOSIS — R609 Edema, unspecified: Secondary | ICD-10-CM

## 2024-09-29 DIAGNOSIS — E7800 Pure hypercholesterolemia, unspecified: Secondary | ICD-10-CM

## 2024-09-29 DIAGNOSIS — Z0000 Encounter for general adult medical examination without abnormal findings: Secondary | ICD-10-CM

## 2024-09-29 DIAGNOSIS — R600 Localized edema: Secondary | ICD-10-CM | POA: Diagnosis not present

## 2024-09-29 DIAGNOSIS — Z8719 Personal history of other diseases of the digestive system: Secondary | ICD-10-CM

## 2024-09-29 DIAGNOSIS — I351 Nonrheumatic aortic (valve) insufficiency: Secondary | ICD-10-CM

## 2024-09-29 DIAGNOSIS — Z9049 Acquired absence of other specified parts of digestive tract: Secondary | ICD-10-CM

## 2024-09-29 DIAGNOSIS — Z8679 Personal history of other diseases of the circulatory system: Secondary | ICD-10-CM

## 2024-09-29 DIAGNOSIS — D120 Benign neoplasm of cecum: Secondary | ICD-10-CM

## 2024-09-29 NOTE — Patient Instructions (Addendum)
 Jacob Le,  Thank you for taking the time for your Medicare Wellness Visit. I appreciate your continued commitment to your health goals. Please review the care plan we discussed, and feel free to reach out if I can assist you further.  Please note that Annual Wellness Visits do not include a physical exam. Some assessments may be limited, especially if the visit was conducted virtually. If needed, we may recommend an in-person follow-up with your provider.  Ongoing Care Seeing your primary care provider every 3 to 6 months helps us  monitor your health and provide consistent, personalized care.   Referrals If a referral was made during today's visit and you haven't received any updates within two weeks, please contact the referred provider directly to check on the status.  Recommended Screenings:  Health Maintenance  Topic Date Due   Medicare Annual Wellness Visit  09/02/2024   COVID-19 Vaccine (4 - 2025-26 season) 10/15/2024*   Zoster (Shingles) Vaccine (1 of 2) 12/28/2024*   Flu Shot  01/13/2025*   DTaP/Tdap/Td vaccine (1 - Tdap) 09/29/2025*   Colon Cancer Screening  01/26/2027   Pneumococcal Vaccine for age over 24  Completed   Meningitis B Vaccine  Aged Out   Hepatitis C Screening  Discontinued  *Topic was postponed. The date shown is not the original due date.       09/29/2024   11:03 AM  Advanced Directives  Does Patient Have a Medical Advance Directive? Yes  Type of Estate Agent of [place];Living will  Does patient want to make changes to medical advance directive? No - Patient declined  Copy of Healthcare Power of Attorney in Chart? No - copy requested    Vision: Annual vision screenings are recommended for early detection of glaucoma, cataracts, and diabetic retinopathy. These exams can also reveal signs of chronic conditions such as diabetes and high blood pressure.  Dental: Annual dental screenings help detect early signs of oral cancer, gum  disease, and other conditions linked to overall health, including heart disease and diabetes.  Please see the attached documents for additional preventive care recommendations.      Next appointment: Follow up in one year for your annual wellness visit    Preventive Care 65 Years and Older, Male Preventive care refers to lifestyle choices and visits with your health care provider that can promote health and wellness. What does preventive care include? A yearly physical exam. This is also called an annual well check. Dental exams once or twice a year. Routine eye exams. Ask your health care provider how often you should have your eyes checked. Personal lifestyle choices, including: Daily care of your teeth and gums. Regular physical activity. Eating a healthy diet. Avoiding tobacco and drug use. Limiting alcohol use. Practicing safe sex. Taking low-dose aspirin  every day. Taking vitamin and mineral supplements as recommended by your health care provider. What happens during an annual well check? The services and screenings done by your health care provider during your annual well check will depend on your age, overall health, lifestyle risk factors, and family history of disease. Counseling  Your health care provider may ask you questions about your: Alcohol use. Tobacco use. Drug use. Emotional well-being. Home and relationship well-being. Sexual activity. Eating habits. History of falls. Memory and ability to understand (cognition). Work and work astronomer. Reproductive health. Screening  You may have the following tests or measurements: Height, weight, and BMI. Blood pressure. Lipid and cholesterol levels. These may be checked every 5 years, or  more frequently if you are over 69 years old. Skin check. Lung cancer screening. You may have this screening every year starting at age 63 if you have a 30-pack-year history of smoking and currently smoke or have quit within  the past 15 years. Fecal occult blood test (FOBT) of the stool. You may have this test every year starting at age 86. Flexible sigmoidoscopy or colonoscopy. You may have a sigmoidoscopy every 5 years or a colonoscopy every 10 years starting at age 66. Hepatitis C blood test. Hepatitis B blood test. Sexually transmitted disease (STD) testing. Diabetes screening. This is done by checking your blood sugar (glucose) after you have not eaten for a while (fasting). You may have this done every 1-3 years. Bone density scan. This is done to screen for osteoporosis. You may have this done starting at age 46. Mammogram. This may be done every 1-2 years. Talk to your health care provider about how often you should have regular mammograms. Talk with your health care provider about your test results, treatment options, and if necessary, the need for more tests. Vaccines  Your health care provider may recommend certain vaccines, such as: Influenza vaccine. This is recommended every year. Tetanus, diphtheria, and acellular pertussis (Tdap, Td) vaccine. You may need a Td booster every 10 years. Zoster vaccine. You may need this after age 89. Pneumococcal 13-valent conjugate (PCV13) vaccine. One dose is recommended after age 45. Pneumococcal polysaccharide (PPSV23) vaccine. One dose is recommended after age 49. Talk to your health care provider about which screenings and vaccines you need and how often you need them. This information is not intended to replace advice given to you by your health care provider. Make sure you discuss any questions you have with your health care provider. Document Released: 10/29/2015 Document Revised: 06/21/2016 Document Reviewed: 08/03/2015 Elsevier Interactive Patient Education  2017 [employer].  Fall Prevention in the Home Falls can cause injuries. They can happen to people of all ages. There are many things you can do to make your home safe and to help prevent falls. What  can I do on the outside of my home? Regularly fix the edges of walkways and driveways and fix any cracks. Remove anything that might make you trip as you walk through a door, such as a raised step or threshold. Trim any bushes or trees on the path to your home. Use bright outdoor lighting. Clear any walking paths of anything that might make someone trip, such as rocks or tools. Regularly check to see if handrails are loose or broken. Make sure that both sides of any steps have handrails. Any raised decks and porches should have guardrails on the edges. Have any leaves, snow, or ice cleared regularly. Use sand or salt on walking paths during winter. Clean up any spills in your garage right away. This includes oil or grease spills. What can I do in the bathroom? Use night lights. Install grab bars by the toilet and in the tub and shower. Do not use towel bars as grab bars. Use non-skid mats or decals in the tub or shower. If you need to sit down in the shower, use a plastic, non-slip stool. Keep the floor dry. Clean up any water that spills on the floor as soon as it happens. Remove soap buildup in the tub or shower regularly. Attach bath mats securely with double-sided non-slip rug tape. Do not have throw rugs and other things on the floor that can make you trip. What  can I do in the bedroom? Use night lights. Make sure that you have a light by your bed that is easy to reach. Do not use any sheets or blankets that are too big for your bed. They should not hang down onto the floor. Have a firm chair that has side arms. You can use this for support while you get dressed. Do not have throw rugs and other things on the floor that can make you trip. What can I do in the kitchen? Clean up any spills right away. Avoid walking on wet floors. Keep items that you use a lot in easy-to-reach places. If you need to reach something above you, use a strong step stool that has a grab bar. Keep  electrical cords out of the way. Do not use floor polish or wax that makes floors slippery. If you must use wax, use non-skid floor wax. Do not have throw rugs and other things on the floor that can make you trip. What can I do with my stairs? Do not leave any items on the stairs. Make sure that there are handrails on both sides of the stairs and use them. Fix handrails that are broken or loose. Make sure that handrails are as long as the stairways. Check any carpeting to make sure that it is firmly attached to the stairs. Fix any carpet that is loose or worn. Avoid having throw rugs at the top or bottom of the stairs. If you do have throw rugs, attach them to the floor with carpet tape. Make sure that you have a light switch at the top of the stairs and the bottom of the stairs. If you do not have them, ask someone to add them for you. What else can I do to help prevent falls? Wear shoes that: Do not have high heels. Have rubber bottoms. Are comfortable and fit you well. Are closed at the toe. Do not wear sandals. If you use a stepladder: Make sure that it is fully opened. Do not climb a closed stepladder. Make sure that both sides of the stepladder are locked into place. Ask someone to hold it for you, if possible. Clearly mark and make sure that you can see: Any grab bars or handrails. First and last steps. Where the edge of each step is. Use tools that help you move around (mobility aids) if they are needed. These include: Canes. Walkers. Scooters. Crutches. Turn on the lights when you go into a dark area. Replace any light bulbs as soon as they burn out. Set up your furniture so you have a clear path. Avoid moving your furniture around. If any of your floors are uneven, fix them. If there are any pets around you, be aware of where they are. Review your medicines with your doctor. Some medicines can make you feel dizzy. This can increase your chance of falling. Ask your doctor  what other things that you can do to help prevent falls. This information is not intended to replace advice given to you by your health care provider. Make sure you discuss any questions you have with your health care provider. Document Released: 07/29/2009 Document Revised: 03/09/2016 Document Reviewed: 11/06/2014 Elsevier Interactive Patient Education  2017 [employer]. Please continue current meds. Watch diet and continue to exercise. Return in one year or as needed. It was a pleasure to see you today.

## 2024-09-29 NOTE — Progress Notes (Signed)
 "  Chief Complaint  Patient presents with   Annual Exam   Medicare Wellness     Subjective:   Jacob Le is a 73 y.o. male who presents for a Medicare Annual Wellness Visit.  Visit info / Clinical Intake: Medicare Wellness Visit Type:: Subsequent Annual Wellness Visit Persons participating in visit and providing information:: patient Medicare Wellness Visit Mode:: In-person (required for WTM) Interpreter Needed?: No Pre-visit prep was completed: yes AWV questionnaire completed by patient prior to visit?: no Living arrangements:: lives with spouse/significant other Patient's Overall Health Status Rating: good Typical amount of pain: none Does pain affect daily life?: no Are you currently prescribed opioids?: no  Dietary Habits and Nutritional Risks How many meals a day?: 3 Eats fruit and vegetables daily?: yes Most meals are obtained by: having others provide food In the last 2 weeks, have you had any of the following?: none Diabetic:: no  Functional Status Activities of Daily Living (to include ambulation/medication): Independent Ambulation: Independent Medication Administration: Independent Home Management (perform basic housework or laundry): Independent Manage your own finances?: yes Primary transportation is: driving Concerns about vision?: no *vision screening is required for WTM* Concerns about hearing?: no  Fall Screening Falls in the past year?: 0 Number of falls in past year: 0 Was there an injury with Fall?: 0 Fall Risk Category Calculator: 0 Patient Fall Risk Level: Low Fall Risk  Fall Risk Patient at Risk for Falls Due to: No Fall Risks Fall risk Follow up: Education provided; Falls prevention discussed; Falls evaluation completed  Home and Transportation Safety: All rugs have non-skid backing?: yes All stairs or steps have railings?: yes Grab bars in the bathtub or shower?: yes Have non-skid surface in bathtub or shower?: yes Good home  lighting?: yes Regular seat belt use?: yes Hospital stays in the last year:: no  Cognitive Assessment Difficulty concentrating, remembering, or making decisions? : no Will 6CIT or Mini Cog be Completed: yes What year is it?: 0 points What month is it?: 0 points About what time is it?: 0 points Count backwards from 20 to 1: 0 points Say the months of the year in reverse: 0 points Repeat the address phrase from earlier: 0 points 6 CIT Score: 0 points  Advance Directives (For Healthcare) Does Patient Have a Medical Advance Directive?: Yes Does patient want to make changes to medical advance directive?: No - Patient declined Type of Advance Directive: Healthcare Power of [place]; Living will Copy of Healthcare Power of Attorney in Chart?: No - copy requested Copy of Living Will in Chart?: No - copy requested  Reviewed/Updated  Reviewed/Updated: Reviewed All (Medical, Surgical, Family, Medications, Allergies, Care Teams, Patient Goals)    Allergies (verified) Codeine and Other   Current Medications (verified) Outpatient Encounter Medications as of 09/29/2024  Medication Sig   amoxicillin -clavulanate (AUGMENTIN ) 875-125 MG tablet Take 1 tablet by mouth every 12 (twelve) hours.   Coenzyme Q10 100 MG capsule Take 100 mg by mouth daily.   metFORMIN  (GLUCOPHAGE ) 500 MG tablet TAKE 1 TABLET BY MOUTH TWICE A DAY WITH FOOD   Multiple Vitamins-Minerals (CENTRUM SILVER 50+MEN) TABS Take 1 tablet by mouth daily.   predniSONE  (DELTASONE ) 20 MG tablet Take 3 tabs PO daily x 5 days.   rosuvastatin  (CRESTOR ) 5 MG tablet TAKE 1 TABLET (5 MG TOTAL) BY MOUTH DAILY.   sildenafil  (VIAGRA ) 100 MG tablet Take 0.5-1 tablets (50-100 mg total) by mouth daily as needed for erectile dysfunction.   triamterene -hydrochlorothiazide (MAXZIDE-25) 37.5-25 MG tablet Take 1  tablet by mouth as needed (for swelling).   No facility-administered encounter medications on file as of 09/29/2024.    History: Past  Medical History:  Diagnosis Date   Chronic kidney disease    stage 3   Complete heart block (HCC) 02/19/2024   Monitor 01/2024: Minimum heart rate 26, average heart rate 47; first-degree AV block; second-degree AV block type I; 6 episodes of third-degree AV block  lasting 1 minute 13 seconds; PVCs <1%    Coronary artery disease    Dysrhythmia    2nd degree heart block, Type 1   H/O nasal septoplasty 1996   Hyperlipidemia    on meds   PONV (postoperative nausea and vomiting)    Sleep apnea    without CPAP   Squamous cell cancer of external ear, left 2018   Valvular heart disease 02/20/2024   TTE 02/19/2024: EF 60-65, no RWMA, GR 2 DD, normal RVSF, mild RVE, normal PASP (RVSP 24.3, moderate LAE, mild-moderate MR, mild-moderate AI, mild aortic stenosis (mean gradient 10, V-max 227 cm/s, DI 0.66)    Past Surgical History:  Procedure Laterality Date   BIOPSY  06/08/2022   Procedure: BIOPSY;  Surgeon: Wilhelmenia Aloha Raddle., MD;  Location: [facility];  Service: Gastroenterology;;   broken left shoulder     no surgerical intervention   CHOLECYSTECTOMY     COLONOSCOPY  2012   In [place]    ESOPHAGOGASTRODUODENOSCOPY (EGD) WITH PROPOFOL  N/A 06/08/2022   Procedure: ESOPHAGOGASTRODUODENOSCOPY (EGD) WITH PROPOFOL ;  Surgeon: Wilhelmenia Aloha Raddle., MD;  Location: [facility];  Service: Gastroenterology;  Laterality: N/A;   EUS  06/08/2022   Procedure: UPPER ENDOSCOPIC ULTRASOUND (EUS) LINEAR;  Surgeon: Wilhelmenia Aloha Raddle., MD;  Location: [facility];  Service: Gastroenterology;;   KNEE ARTHROSCOPY W/ ACL RECONSTRUCTION Left 1990   LEFT HEART CATH AND CORONARY ANGIOGRAPHY N/A 12/18/2017   Procedure: LEFT HEART CATH AND CORONARY ANGIOGRAPHY;  Surgeon: Verlin Lonni BIRCH, MD;  Location: [facility];  Service: Cardiovascular;  Laterality: N/A;   MENISCUS REPAIR Right 1992   pancreatis attack     pericarditis     ROTATOR CUFF REPAIR Right 2014   Family History  Problem  Relation Age of Onset   Cancer Mother    Colon cancer Neg Hx    Colon polyps Neg Hx    Esophageal cancer Neg Hx    Prostate cancer Neg Hx    Rectal cancer Neg Hx    Stomach cancer Neg Hx    Social History   Occupational History   Not on file  Tobacco Use   Smoking status: Never   Smokeless tobacco: Never  Vaping Use   Vaping status: Never Used  Substance and Sexual Activity   Alcohol use: Yes    Alcohol/week: 3.0 standard drinks of alcohol    Types: 1 Cans of beer, 2 Standard drinks or equivalent per week   Drug use: Not Currently   Sexual activity: Not Currently    Birth control/protection: None   Tobacco Counseling Counseling given: No  SDOH Screenings   Food Insecurity: No Food Insecurity (09/29/2024)  Housing: Low Risk (09/29/2024)  Transportation Needs: No Transportation Needs (09/29/2024)  Utilities: Not At Risk (09/29/2024)  Alcohol Screen: Low Risk (09/29/2024)  Depression (PHQ2-9): Low Risk (09/03/2023)  Financial Resource Strain: Low Risk (09/29/2024)  Physical Activity: Sufficiently Active (09/29/2024)  Social Connections: Moderately Isolated (09/29/2024)  Stress: No Stress Concern Present (09/29/2024)  Tobacco Use: Low Risk (09/29/2024)  Health Literacy: Adequate Health Literacy (09/29/2024)  See flowsheets for full screening details  Depression Screen     Goals Addressed               This Visit's Progress     Exercise 3x per week (30 min per time) (pt-stated)               Objective:    Today's Vitals   09/29/24 1111  BP: (!) 150/80  Pulse: (!) 54  SpO2: 97%  Weight: 182 lb (82.6 kg)  Height: 5' 6.5 (1.689 m)  PainSc: 0-No pain   Body mass index is 28.94 kg/m.  Hearing/Vision screen Vision Screening - Comments:: Patient states his eye exam is up to date.  Immunizations and Health Maintenance Health Maintenance  Topic Date Due   COVID-19 Vaccine (4 - 2025-26 season) 10/15/2024 (Originally 06/16/2024)   Zoster Vaccines-  Shingrix (1 of 2) 12/28/2024 (Originally 12/18/1969)   Influenza Vaccine  01/13/2025 (Originally 05/16/2024)   DTaP/Tdap/Td (1 - Tdap) 09/29/2025 (Originally 12/18/1969)   Medicare Annual Wellness (AWV)  09/29/2025   Colonoscopy  01/26/2027   Pneumococcal Vaccine: 50+ Years  Completed   Meningococcal B Vaccine  Aged Out   Hepatitis C Screening  Discontinued        Assessment/Plan:  This is a routine wellness examination for Jacob Le.  Patient Care Team: Perri Ronal PARAS, MD as PCP - General (Internal Medicine) Verlin Lonni BIRCH, MD as PCP - Cardiology (Cardiology) Inocencio Soyla Lunger, MD as PCP - Electrophysiology (Cardiology)  I have personally reviewed and noted the following in the patients chart:   Medical and social history Use of alcohol, tobacco or illicit drugs  Current medications and supplements including opioid prescriptions. Functional ability and status Nutritional status Physical activity Advanced directives List of other physicians Hospitalizations, surgeries, and ER visits in previous 12 months Vitals Screenings to include cognitive, depression, and falls Referrals and appointments  No orders of the defined types were placed in this encounter.  In addition, I have reviewed and discussed with patient certain preventive protocols, quality metrics, and best practice recommendations. A written personalized care plan for preventive services as well as general preventive health recommendations were provided to patient.   Jacob Le Zelda, CMA   09/29/2024   Return in 1 year (on 09/29/2025).  After Visit Summary: (In Person-Printed) AVS printed and given to the patient  Nurse Notes: none  I, Ronal PARAS Perri, MD, have reviewed all documentation for this visit. The documentation on 09/29/2024 for the exam, diagnosis, procedures, and orders are all accurate and complete.  "

## 2024-10-08 ENCOUNTER — Telehealth: Payer: Self-pay

## 2024-10-08 NOTE — Progress Notes (Signed)
" ° °  10/08/2024  Patient ID: Jacob Le, male   DOB: 02/22/1951, 73 y.o.   MRN: [id number]  This patient is appearing on a report for being at risk of failing the adherence measure for diabetes medications this calendar year.   Medication: metformin  XR 500mg  Last fill date: 08/28/2024 for 90 day supply  Insurance report was not up to date. No action needed at this time.   Channing DELENA Mealing, PharmD, DPLA   "

## 2024-10-09 ENCOUNTER — Encounter: Payer: Self-pay | Admitting: Internal Medicine

## 2024-11-20 NOTE — Progress Notes (Signed)
 Jacob Le                                          MRN: [id number]   11/20/2024   The [facility] Quality Team Specialist reviewed this patient medical record for the purposes of chart review for care gap closure. The following were reviewed: chart review for care gap closure-kidney health evaluation for diabetes:eGFR  and uACR.    [facility] Quality Team
